# Patient Record
Sex: Female | Born: 1990 | Race: White | Hispanic: No | Marital: Single | Smoking: Former smoker
Health system: Southern US, Community
[De-identification: ages and names within clinical notes are randomized; demographics above are authoritative.]

## PROBLEM LIST (undated history)

## (undated) DIAGNOSIS — F1010 Alcohol abuse, uncomplicated: Secondary | ICD-10-CM

## (undated) DIAGNOSIS — B019 Varicella without complication: Secondary | ICD-10-CM

## (undated) DIAGNOSIS — F319 Bipolar disorder, unspecified: Secondary | ICD-10-CM

## (undated) DIAGNOSIS — N83209 Unspecified ovarian cyst, unspecified side: Secondary | ICD-10-CM

## (undated) DIAGNOSIS — F419 Anxiety disorder, unspecified: Secondary | ICD-10-CM

## (undated) DIAGNOSIS — J302 Other seasonal allergic rhinitis: Secondary | ICD-10-CM

## (undated) DIAGNOSIS — F32A Depression, unspecified: Secondary | ICD-10-CM

## (undated) DIAGNOSIS — F329 Major depressive disorder, single episode, unspecified: Secondary | ICD-10-CM

## (undated) HISTORY — DX: Major depressive disorder, single episode, unspecified: F32.9

## (undated) HISTORY — DX: Depression, unspecified: F32.A

## (undated) HISTORY — DX: Other seasonal allergic rhinitis: J30.2

## (undated) HISTORY — PX: TONSILLECTOMY: SUR1361

## (undated) HISTORY — DX: Bipolar disorder, unspecified: F31.9

## (undated) HISTORY — DX: Anxiety disorder, unspecified: F41.9

## (undated) HISTORY — DX: Varicella without complication: B01.9

## (undated) HISTORY — DX: Alcohol abuse, uncomplicated: F10.10

---

## 2015-04-08 ENCOUNTER — Emergency Department (HOSPITAL_COMMUNITY)
Admission: EM | Admit: 2015-04-08 | Discharge: 2015-04-08 | Disposition: A | Discharge status: Home / Self Care | Payer: Self-pay | Attending: Emergency Medicine | Admitting: Emergency Medicine

## 2015-04-08 ENCOUNTER — Encounter (HOSPITAL_COMMUNITY): Payer: Self-pay

## 2015-04-08 DIAGNOSIS — Z720 Tobacco use: Secondary | ICD-10-CM | POA: Insufficient documentation

## 2015-04-08 DIAGNOSIS — Z3202 Encounter for pregnancy test, result negative: Secondary | ICD-10-CM | POA: Insufficient documentation

## 2015-04-08 DIAGNOSIS — K921 Melena: Secondary | ICD-10-CM | POA: Insufficient documentation

## 2015-04-08 LAB — URINALYSIS, ROUTINE W REFLEX MICROSCOPIC
Glucose, UA: NEGATIVE mg/dL
Hgb urine dipstick: NEGATIVE
KETONES UR: 15 mg/dL — AB
Leukocytes, UA: NEGATIVE
NITRITE: NEGATIVE
PROTEIN: NEGATIVE mg/dL
SPECIFIC GRAVITY, URINE: 1.031 — AB (ref 1.005–1.030)
UROBILINOGEN UA: 1 mg/dL (ref 0.0–1.0)
pH: 6.5 (ref 5.0–8.0)

## 2015-04-08 LAB — COMPREHENSIVE METABOLIC PANEL
ALT: 11 U/L — ABNORMAL LOW (ref 14–54)
ANION GAP: 8 (ref 5–15)
AST: 22 U/L (ref 15–41)
Albumin: 4.2 g/dL (ref 3.5–5.0)
Alkaline Phosphatase: 73 U/L (ref 38–126)
BILIRUBIN TOTAL: 0.8 mg/dL (ref 0.3–1.2)
BUN: 13 mg/dL (ref 6–20)
CALCIUM: 9.5 mg/dL (ref 8.9–10.3)
CO2: 26 mmol/L (ref 22–32)
Chloride: 102 mmol/L (ref 101–111)
Creatinine, Ser: 0.77 mg/dL (ref 0.44–1.00)
GFR calc Af Amer: >60 mL/min (ref >60)
Glucose, Bld: 93 mg/dL (ref 65–99)
POTASSIUM: 4.5 mmol/L (ref 3.5–5.1)
Sodium: 136 mmol/L (ref 135–145)
TOTAL PROTEIN: 7.2 g/dL (ref 6.5–8.1)

## 2015-04-08 LAB — CBC
HEMATOCRIT: 44 % (ref 36.0–46.0)
Hemoglobin: 14.8 g/dL (ref 12.0–15.0)
MCH: 32 pg (ref 26.0–34.0)
MCHC: 33.6 g/dL (ref 30.0–36.0)
MCV: 95 fL (ref 78.0–100.0)
Platelets: 201 10*3/uL (ref 150–400)
RBC: 4.63 MIL/uL (ref 3.87–5.11)
RDW: 13 % (ref 11.5–15.5)
WBC: 7 10*3/uL (ref 4.0–10.5)

## 2015-04-08 LAB — POC URINE PREG, ED: PREG TEST UR: NEGATIVE

## 2015-04-08 LAB — LIPASE, BLOOD: Lipase: 27 U/L (ref 22–51)

## 2015-04-08 LAB — POC OCCULT BLOOD, ED: FECAL OCCULT BLD: NEGATIVE

## 2015-04-08 NOTE — ED Notes (Signed)
Pt had bowel movement this morning and states it was "bright red, loose liquid stool." Pt reports that she drinks minimum 1 bottle wine and max 2 bottles of wine a night. Pt also smokes .5 packs cigarettes per day.

## 2015-04-08 NOTE — ED Provider Notes (Signed)
CSN: [id number]     Arrival date & time 04/08/15  1039 History   First MD Initiated Contact with Patient 04/08/15 1103     Chief Complaint  Patient presents with  . Blood In Stools     (Consider location/radiation/quality/duration/timing/severity/associated sxs/prior Treatment) HPI Comments: Pt is a 24 yo female who presents to the ED with complaint of blood in stool, onset this morning. Pt reports having a BM this morning when she woke up and noted blood in her stool. She notes that she has chronic diarrhea. Pt then states she had a second BM and reports bright red loose liquid stool. She endorses nausea and constant "aching" lower abdominal pain that she notes has improved since her last BM. Denies fever, headache, SOB, CP, vomiting, constipation, urinary sxs, hematuria, numbness, tingling, weakness, lightheadedness, dizziness. Pt states that she has a drinking problem and has been trying to go to AA. Pt reports having hemorrhoids in the past. LMP 2 weeks ago.   No past medical history on file. No past surgical history on file. No family history on file. Social History  Substance Use Topics  . Smoking status: Current Every Day Smoker -- 0.50 packs/day    Types: Cigarettes  . Smokeless tobacco: Not on file  . Alcohol Use: Yes     Comment: 1-2 bottles/night    OB History    No data available     Review of Systems  Gastrointestinal: Positive for nausea, abdominal pain and blood in stool.  All other systems reviewed and are negative.     Allergies  Review of patient's allergies indicates no known allergies.  Home Medications   Prior to Admission medications   Not on File   BP 126/77 mmHg  Pulse 75  Temp(Src) 98.2 F (36.8 C) (Oral)  Resp 16  Ht 5\' 6"  (1.676 m)  Wt 170 lb (77.111 kg)  BMI 27.45 kg/m2  SpO2 100%  LMP 03/26/2015 (Approximate) Physical Exam  Constitutional: She is oriented to person, place, and time. She appears well-developed and well-nourished. No  distress.  HENT:  Head: Normocephalic and atraumatic.  Mouth/Throat: Oropharynx is clear and moist.  Eyes: Conjunctivae and EOM are normal. Pupils are equal, round, and reactive to light. Right eye exhibits no discharge. Left eye exhibits no discharge. No scleral icterus.  Neck: Normal range of motion. Neck supple.  Cardiovascular: Normal rate, regular rhythm, normal heart sounds and intact distal pulses.   Pulmonary/Chest: Effort normal and breath sounds normal. She has no wheezes. She has no rales. She exhibits no tenderness.  Abdominal: Soft. Bowel sounds are normal. She exhibits no distension and no mass. There is no tenderness. There is no rebound and no guarding.  Genitourinary: Rectum normal. Rectal exam shows no external hemorrhoid, no internal hemorrhoid, no fissure, no mass, no tenderness and anal tone normal.  Musculoskeletal: She exhibits no edema.  Lymphadenopathy:    She has no cervical adenopathy.  Neurological: She is alert and oriented to person, place, and time.  Skin: Skin is warm and dry.  Nursing note and vitals reviewed.   ED Course  Procedures (including critical care time) Labs Review Labs Reviewed  COMPREHENSIVE METABOLIC PANEL - Abnormal; Notable for the following:    ALT 11 (*)    All other components within normal limits  URINALYSIS, ROUTINE W REFLEX MICROSCOPIC (NOT AT [facility]) - Abnormal; Notable for the following:    APPearance HAZY (*)    Specific Gravity, Urine 1.031 (*)    Bilirubin Urine  SMALL (*)    Ketones, ur 15 (*)    All other components within normal limits  LIPASE, BLOOD  CBC  POC URINE PREG, ED  POC OCCULT BLOOD, ED    Imaging Review No results found. I have personally reviewed and evaluated these images and lab results as part of my medical decision-making.  Filed Vitals:   04/08/15 1400  BP: 128/76  Pulse: 84  Temp:   Resp: 16     MDM   Final diagnoses:  Blood in stool    Pt reports with blood in her loose stool x2 this  morning. Associated abdominal pain and nausea that improved after 2nd BM. Pt reports chronic diarrhea. VSS. Abdominal exam benign, rectal exam revealed no hemorrhoids, fissures or occult bleeding. Hemoccult preformed but no stool was able to be collected during exam. CBC, CMP, lipase, UA unremarkable. Urine pregnancy negative. I do not feel that imaging is warranted at this time, pt is hemodynamically stable, abdominal exam benign. Plan to d/c pt home with PCP follow up, pt given Resource Guide. Pt advised to follow up with PCP this week and return precautions given. Pt agrees with plan.     Caitlyn Hall [place], [place] 04/08/15 1429  Caitlyn Memos, MD 04/09/15 [phone]

## 2015-04-08 NOTE — Discharge Instructions (Signed)
Please follow up with a primary care provider listed in the Resource Guide provided below in the next week. Please return to the Emergency Department if symptoms worsen or new onset of fever, abdominal pain, vomiting, constipation, increase bleeding, dizziness, lightheadedness.   Emergency Department Resource Guide 1) Find a Doctor and Pay Out of Pocket Although you won't have to find out who is covered by your insurance plan, it is a good idea to ask around and get recommendations. You will then need to call the office and see if the doctor you have chosen will accept you as a new patient and what types of options they offer for patients who are self-pay. Some doctors offer discounts or will set up payment plans for their patients who do not have insurance, but you will need to ask so you aren't surprised when you get to your appointment.  2) Contact Your Local Health Department Not all health departments have doctors that can see patients for sick visits, but many do, so it is worth a call to see if yours does. If you don't know where your local health department is, you can check in your phone book. The CDC also has a tool to help you locate your state's health department, and many state websites also have listings of all of their local health departments.  3) Find a Walk-in Clinic If your illness is not likely to be very severe or complicated, you may want to try a walk in clinic. These are popping up all over the country in pharmacies, drugstores, and shopping centers. They're usually staffed by nurse practitioners or physician assistants that have been trained to treat common illnesses and complaints. They're usually fairly quick and inexpensive. However, if you have serious medical issues or chronic medical problems, these are probably not your best option.  No Primary Care Doctor: - Call Health Connect at  [phone] - they can help you locate a primary care doctor that  accepts your insurance,  provides certain services, etc. - Physician Referral Service- [phone]  Chronic Pain Problems: Organization         Address  Phone   Notes  [facility]  [phone] Patients need to be referred by their primary care doctor.   Medication Assistance: Organization         Address  Phone   Notes  [facility] Medication [facility] [address] [place] [place], [place] [address] [phone] --Must be a resident of [facility] -- Must have NO insurance coverage whatsoever (no Medicaid/ Medicare, etc.) -- The pt. MUST have a primary care doctor that directs their care regularly and follows them in the community   [facility]  [phone]   [employer]  [phone]    Agencies that provide inexpensive medical care: Organization         Address  Phone   Notes  [facility]  [phone]   [facility]    [phone]   [facility] [address] [place], [place] [address] [phone]   [facility] [place] [facility] [address] [place]. [address] [place] [phone]   Planned Parenthood    [phone]   [facility]    [phone]   Community Health and [facility]  [address], [place] Phone:  [phone], Fax:  [phone] Hours of Operation:  9 am - 6 pm, M-F.  Also accepts Medicaid/Medicare  and self-pay.  [facility] for [place], [place] Phone: [phone], Fax: [phone]. Hours of Operation:  8:30 am - 5:30 pm, M-F.  Also accepts Medicaid and self-pay.  [facility] [place] [address] [place] Phone: [phone]   [place], [place], [place] [phone], Ext. [phone] Mondays & Thursdays: 7-9 AM.  First 15 patients are seen on a first come, first serve basis.    [place] Providers:  Organization         Address  Phone    Notes  [facility] [address], [place] [phone] Also accepts self-pay patients.  [facility] [address], [place]  [phone]   [place], [address], [place] [phone]   [facility] [address] [place] [phone]   Lucianne Lei [address], [place]   [phone] Only accepts [place] Access [place] patients after they have their name applied to their card.   Self-Pay (no insurance) in [facility]:  Organization         Address  Phone   Notes  Sickle Cell Patients, [facility] [place] [phone]   [facility] [place] [phone]   [facility] [place]  [place], [place], [place] [phone]   [facility]/Dr. Osei-Bonsu  [address] [place] or [address], [place] [phone] Phone number for both [place] and [place] locations is the same.  Urgent Medical and [facility] [address] [place] [phone]   [facility] [address] [place] or [address] [phone]   [facility] [address] [place] [phone], phone; [phone], fax Sees patients 1st and 3rd Saturday of every month.  Must not qualify for public or private insurance (i.e. Medicaid, Medicare, [place] Health Choice, Veterans' Benefits)  Household income should be no more than 200% of the poverty level The clinic cannot treat you if you are pregnant or think you are pregnant  Sexually transmitted diseases are not treated at the clinic.    Dental Care: Organization         Address  Phone  Notes  [facility] Department of [facility] [place] [phone] Accepts children up to age 87 who are enrolled in [place] or [place]; pregnant women with a Medicaid card; and children who have applied for Medicaid or [place] Health Choice, but were declined, whose parents can pay a reduced fee at time of service.  [facility] Department of [facility]  [address], [place] [phone] Accepts children up to age 37 who are enrolled in [place] or [place]; pregnant women with a Medicaid card; and children who have applied for Medicaid or [place] Health Choice, but were declined, whose parents can pay a reduced fee at time of service.  [place] Adult [facility] Access PROGRAM  [place] [phone] Patients are seen by appointment only. Walk-ins are not accepted. [place] will see patients 36 years of age and older. Monday - Tuesday (8am-5pm) Most Wednesdays (  8:30-5pm) $30 per visit, cash only  [facility] Adult Dental Access PROGRAM  [address], [facility] [phone] Patients are seen by appointment only. Walk-ins are not accepted. [place] will see patients 81 years of age and older. One Wednesday Evening (Monthly: Volunteer Based).  $30 per visit, cash only  [place]  [phone] for adults; Children under age 43, call Graduate Pediatric Dentistry at [phone]. Children aged 15-14, please call [phone] to request a pediatric application.  Dental services are provided in all areas of dental care including fillings, crowns and bridges, complete and partial dentures, implants, gum treatment, root canals, and extractions. Preventive care is also provided. Treatment is provided to both adults and children. Patients are selected via a lottery and there is often a waiting list.   [facility] [address], [place]  [phone] [email or web address]   [facility] [address] [place], [place] [phone], Ext.  [phone] Second and Fourth Thursday of each month, opens at 6:30 AM; Clinic ends at 9 AM.  Patients are seen on a first-come first-served basis, and a limited number are seen during each clinic.   [facility]  [address] Hillard Danker [place], [place] [phone]   Eligibility Requirements You must have lived in [place], [place], or [place] counties for at least the last three months.   You cannot be eligible for state or federal sponsored [employer], including [employer], [place], or [employer].   You generally cannot be eligible for healthcare insurance through your employer.    How to apply: Eligibility screenings are held every Tuesday and Wednesday afternoon from 1:00 pm until 4:00 pm. You do not need an appointment for the interview!  [facility] [address], [place], [place]   [place]  [place] Department  [place]  [phone]    Behavioral Health Resources in the Community: Intensive Outpatient Programs Organization         Address  Phone  Notes  [place]. [address], [place], [place] [phone]   [facility] Outpatient [address], [place], [place]   ADS: Alcohol & Drug Svcs [address], [place], [place]   [place] [address]  [place], [place] or [phone]   Substance Abuse Resources Organization         Address  Phone  Notes  Alcohol and Drug Services  [phone]   [place]  [phone]   [facility] [place]   Chinita Pester  [phone]   Residential & Outpatient Substance Abuse Program  [phone]   Psychological Services Organization         Address  Phone  Notes  [facility] [place]  [place]  [phone]   [place] [address], [place] or [phone]    Mobile Crisis Teams Organization         Address  Phone  Notes  [facility], Mobile Crisis Care Unit  [phone]   [facility]  [address] [place], [place]   [place] [address], [place] [phone]    Self-Help/Support Groups Organization         Address  Phone  Notes  [facility]ental [facility] of [place] - variety of support groups  [place] Call for more information  Narcotics Anonymous (NA), [facility] [address], [employer] [place]  2 meetings at this location   Special educational needs teacher         Address  Phone  Notes  [facility] Residential Treatment [place],    [place]  [phone]   [facility]  [address] [place] [phone], [place], [place]   [place], [place] [phone] Admissions: 8am-3pm M-F  Incentives Substance [place] [address]    [place], [place] [phone]   [facility] [address] [place], [place], [place]   [facility] [address],  [place], [place]   [facility] - Intensive Outpatient [address] Kristeen [address], [place], [place]   [facility] ([place].,  [place], [place] [phone] or [phone]   [facility] Services (RTS) [address], [place], [place] Accepts Medicaid  [facility] [place] [address]  [place] [phone] Substance Abuse/Addiction Treatment   [facility] Organization         Address  Phone  Notes  [facility]  [phone]   Domenic Schwab, PhD [address] Arlis Porta [place], [place]   [phone] or [phone]   [place]  [place], [place] [phone]   [facility] [address] [place], [place] [phone] Insurance/Medicaid/sponsorship through [facility] and Families [address]                                    [place], [place] [phone] [place] [address], [place] [phone]    Dr. Adele Schilder  [phone]   [facility] Dept. 1) [address] [place] 2) [place] 3)  [place] [address], [place] [phone]  [phone]   [place] [phone] or [phone] (After Hours)

## 2015-10-01 ENCOUNTER — Emergency Department (HOSPITAL_COMMUNITY)
Admission: EM | Admit: 2015-10-01 | Discharge: 2015-10-01 | Disposition: A | Discharge status: Home / Self Care | Payer: Self-pay | Attending: Emergency Medicine | Admitting: Emergency Medicine

## 2015-10-01 ENCOUNTER — Encounter (HOSPITAL_COMMUNITY): Payer: Self-pay | Admitting: Emergency Medicine

## 2015-10-01 ENCOUNTER — Emergency Department (HOSPITAL_COMMUNITY): Payer: Self-pay

## 2015-10-01 DIAGNOSIS — S3991XA Unspecified injury of abdomen, initial encounter: Secondary | ICD-10-CM | POA: Insufficient documentation

## 2015-10-01 DIAGNOSIS — Y9289 Other specified places as the place of occurrence of the external cause: Secondary | ICD-10-CM | POA: Insufficient documentation

## 2015-10-01 DIAGNOSIS — Z8742 Personal history of other diseases of the female genital tract: Secondary | ICD-10-CM | POA: Insufficient documentation

## 2015-10-01 DIAGNOSIS — Y998 Other external cause status: Secondary | ICD-10-CM | POA: Insufficient documentation

## 2015-10-01 DIAGNOSIS — W541XXA Struck by dog, initial encounter: Secondary | ICD-10-CM | POA: Insufficient documentation

## 2015-10-01 DIAGNOSIS — R110 Nausea: Secondary | ICD-10-CM | POA: Insufficient documentation

## 2015-10-01 DIAGNOSIS — F17210 Nicotine dependence, cigarettes, uncomplicated: Secondary | ICD-10-CM | POA: Insufficient documentation

## 2015-10-01 DIAGNOSIS — Z3202 Encounter for pregnancy test, result negative: Secondary | ICD-10-CM | POA: Insufficient documentation

## 2015-10-01 DIAGNOSIS — R1032 Left lower quadrant pain: Secondary | ICD-10-CM

## 2015-10-01 DIAGNOSIS — Y9389 Activity, other specified: Secondary | ICD-10-CM | POA: Insufficient documentation

## 2015-10-01 HISTORY — DX: Unspecified ovarian cyst, unspecified side: N83.209

## 2015-10-01 LAB — URINALYSIS, ROUTINE W REFLEX MICROSCOPIC
BILIRUBIN URINE: NEGATIVE
GLUCOSE, UA: NEGATIVE mg/dL
Hgb urine dipstick: NEGATIVE
KETONES UR: NEGATIVE mg/dL
LEUKOCYTES UA: NEGATIVE
NITRITE: NEGATIVE
PROTEIN: NEGATIVE mg/dL
Specific Gravity, Urine: >1.046 — ABNORMAL HIGH (ref 1.005–1.030)
pH: 7 (ref 5.0–8.0)

## 2015-10-01 LAB — COMPREHENSIVE METABOLIC PANEL
ALK PHOS: 75 U/L (ref 38–126)
ALT: 14 U/L (ref 14–54)
AST: 27 U/L (ref 15–41)
Albumin: 4.2 g/dL (ref 3.5–5.0)
Anion gap: 10 (ref 5–15)
BILIRUBIN TOTAL: 0.4 mg/dL (ref 0.3–1.2)
BUN: 8 mg/dL (ref 6–20)
CALCIUM: 9.5 mg/dL (ref 8.9–10.3)
CO2: 27 mmol/L (ref 22–32)
CREATININE: 0.77 mg/dL (ref 0.44–1.00)
Chloride: 103 mmol/L (ref 101–111)
GFR calc Af Amer: >60 mL/min (ref >60)
Glucose, Bld: 118 mg/dL — ABNORMAL HIGH (ref 65–99)
Potassium: 4.7 mmol/L (ref 3.5–5.1)
Sodium: 140 mmol/L (ref 135–145)
TOTAL PROTEIN: 6.6 g/dL (ref 6.5–8.1)

## 2015-10-01 LAB — I-STAT BETA HCG BLOOD, ED (MC, WL, AP ONLY): I-stat hCG, quantitative: <5 m[IU]/mL (ref <5)

## 2015-10-01 LAB — CBC
HCT: 44.6 % (ref 36.0–46.0)
Hemoglobin: 14.4 g/dL (ref 12.0–15.0)
MCH: 30.6 pg (ref 26.0–34.0)
MCHC: 32.3 g/dL (ref 30.0–36.0)
MCV: 94.7 fL (ref 78.0–100.0)
PLATELETS: 197 10*3/uL (ref 150–400)
RBC: 4.71 MIL/uL (ref 3.87–5.11)
RDW: 13 % (ref 11.5–15.5)
WBC: 7.4 10*3/uL (ref 4.0–10.5)

## 2015-10-01 LAB — LIPASE, BLOOD: Lipase: 29 U/L (ref 11–51)

## 2015-10-01 MED ORDER — MORPHINE SULFATE (PF) 4 MG/ML IV SOLN
4.0000 mg | Freq: Once | INTRAVENOUS | Status: AC
  Administered 2015-10-01: 4 mg via INTRAVENOUS
  Filled 2015-10-01: qty 1

## 2015-10-01 MED ORDER — IOHEXOL 300 MG/ML  SOLN
25.0000 mL | Freq: Once | INTRAMUSCULAR | Status: AC | PRN
  Administered 2015-10-01: 25 mL via ORAL

## 2015-10-01 MED ORDER — IOHEXOL 300 MG/ML  SOLN
100.0000 mL | Freq: Once | INTRAMUSCULAR | Status: AC | PRN
  Administered 2015-10-01: 100 mL via INTRAVENOUS

## 2015-10-01 MED ORDER — ONDANSETRON HCL 4 MG/2ML IJ SOLN
4.0000 mg | Freq: Once | INTRAMUSCULAR | Status: AC
  Administered 2015-10-01: 4 mg via INTRAVENOUS
  Filled 2015-10-01: qty 2

## 2015-10-01 MED ORDER — OXYCODONE HCL 5 MG PO TABS: 5.0000 mg | ORAL_TABLET | ORAL | Status: DC | PRN

## 2015-10-01 MED ORDER — SODIUM CHLORIDE 0.9 % IV SOLN
INTRAVENOUS | Status: DC
  Administered 2015-10-01: 08:00:00 via INTRAVENOUS

## 2015-10-01 NOTE — ED Provider Notes (Addendum)
CSN: [id number]     Arrival date & time 10/01/15  2130 History   First MD Initiated Contact with Patient 10/01/15 0751     Chief Complaint  Patient presents with  . Abdominal Pain     (Consider location/radiation/quality/duration/timing/severity/associated sxs/prior Treatment) HPI Comments: Patient here complaining of left upper and left lower quadrant abdominal pain started after her dog jumped on her belly. This happened approximately 2 days ago and pain characterized as dull and persistent and increasing. She has had associated nausea but no vomiting. No flank pain or hematuria. No vaginal bleeding or discharge. Had some watery diarrhea and has been experiencing a viral illness. No syncope or near syncope but does endorse diffuse weakness. Pain is worse with walking or bending over. No treatment use prior to arrival  Patient is a 25 y.o. female presenting with abdominal pain. The history is provided by the patient.  Abdominal Pain   Past Medical History  Diagnosis Date  . Ovarian cyst    Past Surgical History  Procedure Laterality Date  . Tonsillectomy     No family history on file. Social History  Substance Use Topics  . Smoking status: Current Every Day Smoker -- 0.50 packs/day    Types: Cigarettes  . Smokeless tobacco: None  . Alcohol Use: No     Comment: former   OB History    No data available     Review of Systems  Gastrointestinal: Positive for abdominal pain.  All other systems reviewed and are negative.     Allergies  Amitriptyline; Amphetamines; Atomoxetine; Carisoprodol; Celecoxib; Codeine; Dextroamphetamine; Diclofenac; Doxepin; Fluoxetine; Guanfacine; Ibuprofen; Imipramine; Lisdexamfetamine; Meloxicam; Meperidine and related; Methadone; Naltrexone; Selegiline; Tramadol; and Venlafaxine  Home Medications   Prior to Admission medications   Not on File   BP 150/99 mmHg  Pulse 101  Temp(Src) 98.5 F (36.9 C) (Oral)  Resp 18  Wt 72.712 kg  SpO2  100%  LMP 09/17/2015 Physical Exam  Constitutional: She is oriented to person, place, and time. She appears well-developed and well-nourished.  Non-toxic appearance. No distress.  HENT:  Head: Normocephalic and atraumatic.  Eyes: Conjunctivae, EOM and lids are normal. Pupils are equal, round, and reactive to light.  Neck: Normal range of motion. Neck supple. No tracheal deviation present. No thyroid mass present.  Cardiovascular: Normal rate, regular rhythm and normal heart sounds.  Exam reveals no gallop.   No murmur heard. Pulmonary/Chest: Effort normal and breath sounds normal. No stridor. No respiratory distress. She has no decreased breath sounds. She has no wheezes. She has no rhonchi. She has no rales.  Abdominal: Soft. Normal appearance and bowel sounds are normal. She exhibits no distension. There is no tenderness. There is no rigidity, no rebound, no guarding and no CVA tenderness.    Musculoskeletal: Normal range of motion. She exhibits no edema or tenderness.  Neurological: She is alert and oriented to person, place, and time. She has normal strength. No cranial nerve deficit or sensory deficit. GCS eye subscore is 4. GCS verbal subscore is 5. GCS motor subscore is 6.  Skin: Skin is warm and dry. No abrasion and no rash noted.  Psychiatric: She has a normal mood and affect. Her speech is normal and behavior is normal.  Nursing note and vitals reviewed.   ED Course  Procedures (including critical care time) Labs Review Labs Reviewed  LIPASE, BLOOD  COMPREHENSIVE METABOLIC PANEL  CBC  URINALYSIS, ROUTINE W REFLEX MICROSCOPIC (NOT AT [facility])  I-STAT BETA HCG BLOOD, ED (  [facility], [facility], [facility] ONLY)    Imaging Review No results found. I have personally reviewed and evaluated these images and lab results as part of my medical decision-making.   EKG Interpretation None      MDM   Final diagnoses:  None    Patient given pain meds here and does flow better. Abdominal CT results  reviewed with her. She has known history of ovarian cysts and will follow-up with her gynecologist    Lorre NickAnthony Brylon Brenning, MD 10/01/15 1105  Lorre NickAnthony Joslyn Ramos, MD 10/01/15 1105

## 2015-10-01 NOTE — ED Notes (Signed)
Patient transported to CT 

## 2015-10-01 NOTE — Discharge Instructions (Signed)
Abdominal Pain, Adult °Many things can cause abdominal pain. Usually, abdominal pain is not caused by a disease and will improve without treatment. It can often be observed and treated at home. Your health care provider will do a physical exam and possibly order blood tests and X-rays to help determine the seriousness of your pain. However, in many cases, more time must pass before a clear cause of the pain can be found. Before that point, your health care provider may not know if you need more testing or further treatment. °HOME CARE INSTRUCTIONS °Monitor your abdominal pain for any changes. The following actions may help to alleviate any discomfort you are experiencing: °· Only take over-the-counter or prescription medicines as directed by your health care provider. °· Do not take laxatives unless directed to do so by your health care provider. °· Try a clear liquid diet (broth, tea, or water) as directed by your health care provider. Slowly move to a bland diet as tolerated. °SEEK MEDICAL CARE IF: °· You have unexplained abdominal pain. °· You have abdominal pain associated with nausea or diarrhea. °· You have pain when you urinate or have a bowel movement. °· You experience abdominal pain that wakes you in the night. °· You have abdominal pain that is worsened or improved by eating food. °· You have abdominal pain that is worsened with eating fatty foods. °· You have a fever. °SEEK IMMEDIATE MEDICAL CARE IF: °· Your pain does not go away within 2 hours. °· You keep throwing up (vomiting). °· Your pain is felt only in portions of the abdomen, such as the right side or the left lower portion of the abdomen. °· You pass bloody or black tarry stools. °MAKE SURE YOU: °· Understand these instructions. °· Will watch your condition. °· Will get help right away if you are not doing well or get worse. °  °This information is not intended to replace advice given to you by your health care provider. Make sure you discuss  any questions you have with your health care provider. °  °Document Released: 04/17/2005 Document Revised: 03/29/2015 Document Reviewed: 03/17/2013 °Elsevier Interactive Patient Education ©2016 [employer] °Ovarian Cyst °An ovarian cyst is a fluid-filled sac that forms on an ovary. The ovaries are small organs that produce eggs in women. Various types of cysts can form on the ovaries. Most are not cancerous. Many do not cause problems, and they often go away on their own. Some may cause symptoms and require treatment. Common types of ovarian cysts include: °· Functional cysts--These cysts may occur every month during the menstrual cycle. This is normal. The cysts usually go away with the next menstrual cycle if the woman does not get pregnant. Usually, there are no symptoms with a functional cyst. °· Endometrioma cysts--These cysts form from the tissue that lines the uterus. They are also called "chocolate cysts" because they become filled with blood that turns brown. This type of cyst can cause pain in the lower abdomen during intercourse and with your menstrual period. °· Cystadenoma cysts--This type develops from the cells on the outside of the ovary. These cysts can get very big and cause lower abdomen pain and pain with intercourse. This type of cyst can twist on itself, cut off its blood supply, and cause severe pain. It can also easily rupture and cause a lot of pain. °· Dermoid cysts--This type of cyst is sometimes found in both ovaries. These cysts may contain different kinds of body tissue, such as   skin, teeth, hair, or cartilage. They usually do not cause symptoms unless they get very big. °· Theca lutein cysts--These cysts occur when too much of a certain hormone (human chorionic gonadotropin) is produced and overstimulates the ovaries to produce an egg. This is most common after procedures used to assist with the conception of a baby (in vitro fertilization). °CAUSES  °· Fertility drugs can cause a  condition in which multiple large cysts are formed on the ovaries. This is called ovarian hyperstimulation syndrome. °· A condition called polycystic ovary syndrome can cause hormonal imbalances that can lead to nonfunctional ovarian cysts. °SIGNS AND SYMPTOMS  °Many ovarian cysts do not cause symptoms. If symptoms are present, they may include: °· Pelvic pain or pressure. °· Pain in the lower abdomen. °· Pain during sexual intercourse. °· Increasing girth (swelling) of the abdomen. °· Abnormal menstrual periods. °· Increasing pain with menstrual periods. °· Stopping having menstrual periods without being pregnant. °DIAGNOSIS  °These cysts are commonly found during a routine or annual pelvic exam. Tests may be ordered to find out more about the cyst. These tests may include: °· Ultrasound. °· X-ray of the pelvis. °· CT scan. °· MRI. °· Blood tests. °TREATMENT  °Many ovarian cysts go away on their own without treatment. Your health care provider may want to check your cyst regularly for 2-3 months to see if it changes. For women in menopause, it is particularly important to monitor a cyst closely because of the higher rate of ovarian cancer in menopausal women. When treatment is needed, it may include any of the following: °· A procedure to drain the cyst (aspiration). This may be done using a long needle and ultrasound. It can also be done through a laparoscopic procedure. This involves using a thin, lighted tube with a tiny camera on the end (laparoscope) inserted through a small incision. °· Surgery to remove the whole cyst. This may be done using laparoscopic surgery or an open surgery involving a larger incision in the lower abdomen. °· Hormone treatment or birth control pills. These methods are sometimes used to help dissolve a cyst. °HOME CARE INSTRUCTIONS  °· Only take over-the-counter or prescription medicines as directed by your health care provider. °· Follow up with your health care provider as  directed. °· Get regular pelvic exams and Pap tests. °SEEK MEDICAL CARE IF:  °· Your periods are late, irregular, or painful, or they stop. °· Your pelvic pain or abdominal pain does not go away. °· Your abdomen becomes larger or swollen. °· You have pressure on your bladder or trouble emptying your bladder completely. °· You have pain during sexual intercourse. °· You have feelings of fullness, pressure, or discomfort in your stomach. °· You lose weight for no apparent reason. °· You feel generally ill. °· You become constipated. °· You lose your appetite. °· You develop acne. °· You have an increase in body and facial hair. °· You are gaining weight, without changing your exercise and eating habits. °· You think you are pregnant. °SEEK IMMEDIATE MEDICAL CARE IF:  °· You have increasing abdominal pain. °· You feel sick to your stomach (nauseous), and you throw up (vomit). °· You develop a fever that comes on suddenly. °· You have abdominal pain during a bowel movement. °· Your menstrual periods become heavier than usual. °MAKE SURE YOU: °· Understand these instructions. °· Will watch your condition. °· Will get help right away if you are not doing well or get worse. °  °  This information is not intended to replace advice given to you by your health care provider. Make sure you discuss any questions you have with your health care provider. °  °Document Released: 07/08/2005 Document Revised: 07/13/2013 Document Reviewed: 03/15/2013 °Elsevier Interactive Patient Education ©2016 [employer]. ° °

## 2015-10-01 NOTE — ED Notes (Signed)
Pt reports that Friday night her 12 lb dog jumped on her stomach. Saturday she was unable to walk without bending over. Pt reports nausea with dry heaves.

## 2015-12-16 ENCOUNTER — Emergency Department (HOSPITAL_COMMUNITY): Payer: Self-pay

## 2015-12-16 ENCOUNTER — Emergency Department (HOSPITAL_COMMUNITY)
Admission: EM | Admit: 2015-12-16 | Discharge: 2015-12-16 | Disposition: A | Discharge status: Home / Self Care | Payer: Self-pay | Attending: Emergency Medicine | Admitting: Emergency Medicine

## 2015-12-16 ENCOUNTER — Encounter (HOSPITAL_COMMUNITY): Payer: Self-pay

## 2015-12-16 DIAGNOSIS — R0602 Shortness of breath: Secondary | ICD-10-CM | POA: Insufficient documentation

## 2015-12-16 DIAGNOSIS — Z79891 Long term (current) use of opiate analgesic: Secondary | ICD-10-CM | POA: Insufficient documentation

## 2015-12-16 DIAGNOSIS — F17210 Nicotine dependence, cigarettes, uncomplicated: Secondary | ICD-10-CM | POA: Insufficient documentation

## 2015-12-16 MED ORDER — ALBUTEROL SULFATE HFA 108 (90 BASE) MCG/ACT IN AERS: 1.0000 | INHALATION_SPRAY | Freq: Four times a day (QID) | RESPIRATORY_TRACT | Status: DC | PRN

## 2015-12-16 MED ORDER — PREDNISONE 10 MG PO TABS: ORAL_TABLET | ORAL | Status: DC

## 2015-12-16 NOTE — Discharge Instructions (Signed)
Cough, Adult °Coughing is a reflex that clears your throat and your airways. Coughing helps to heal and protect your lungs. It is normal to cough occasionally, but a cough that happens with other symptoms or lasts a long time may be a sign of a condition that needs treatment. A cough may last only 2-3 weeks (acute), or it may last longer than 8 weeks (chronic). °CAUSES °Coughing is commonly caused by: °· Breathing in substances that irritate your lungs. °· A viral or bacterial respiratory infection. °· Allergies. °· Asthma. °· Postnasal drip. °· Smoking. °· Acid backing up from the stomach into the esophagus (gastroesophageal reflux). °· Certain medicines. °· Chronic lung problems, including COPD (or rarely, lung cancer). °· Other medical conditions such as heart failure. °HOME CARE INSTRUCTIONS  °Pay attention to any changes in your symptoms. Take these actions to help with your discomfort: °· Take medicines only as told by your health care provider. °· If you were prescribed an antibiotic medicine, take it as told by your health care provider. Do not stop taking the antibiotic even if you start to feel better. °· Talk with your health care provider before you take a cough suppressant medicine. °· Drink enough fluid to keep your urine clear or pale yellow. °· If the air is dry, use a cold steam vaporizer or humidifier in your bedroom or your home to help loosen secretions. °· Avoid anything that causes you to cough at work or at home. °· If your cough is worse at night, try sleeping in a semi-upright position. °· Avoid cigarette smoke. If you smoke, quit smoking. If you need help quitting, ask your health care provider. °· Avoid caffeine. °· Avoid alcohol. °· Rest as needed. °SEEK MEDICAL CARE IF:  °· You have new symptoms. °· You cough up pus. °· Your cough does not get better after 2-3 weeks, or your cough gets worse. °· You cannot control your cough with suppressant medicines and you are losing sleep. °· You  develop pain that is getting worse or pain that is not controlled with pain medicines. °· You have a fever. °· You have unexplained weight loss. °· You have night sweats. °SEEK IMMEDIATE MEDICAL CARE IF: °· You cough up blood. °· You have difficulty breathing. °· Your heartbeat is very fast. °  °This information is not intended to replace advice given to you by your health care provider. Make sure you discuss any questions you have with your health care provider. °  °Document Released: 01/04/2011 Document Revised: 03/29/2015 Document Reviewed: 09/14/2014 °Elsevier Interactive Patient Education ©2016 [employer] Inc. ° °Shortness of Breath °Shortness of breath means you have trouble breathing. It could also mean that you have a medical problem. You should get immediate medical care for shortness of breath. °CAUSES  °· Not enough oxygen in the air such as with high altitudes or a smoke-filled room. °· Certain lung diseases, infections, or problems. °· Heart disease or conditions, such as angina or heart failure. °· Low red blood cells (anemia). °· Poor physical fitness, which can cause shortness of breath when you exercise. °· Chest or back injuries or stiffness. °· Being overweight. °· Smoking. °· Anxiety, which can make you feel like you are not getting enough air. °DIAGNOSIS  °Serious medical problems can often be found during your physical exam. Tests may also be done to determine why you are having shortness of breath. Tests may include: °· Chest X-rays. °· Lung function tests. °· Blood tests. °· An electrocardiogram (  ECG). °· An ambulatory electrocardiogram. An ambulatory ECG records your heartbeat patterns over a 24-hour period. °· Exercise testing. °· A transthoracic echocardiogram (TTE). During echocardiography, sound waves are used to evaluate how blood flows through your heart. °· A transesophageal echocardiogram (TEE). °· Imaging scans. °Your health care provider may not be able to find a cause for your  shortness of breath after your exam. In this case, it is important to have a follow-up exam with your health care provider as directed.  °TREATMENT  °Treatment for shortness of breath depends on the cause of your symptoms and can vary greatly. °HOME CARE INSTRUCTIONS  °· Do not smoke. Smoking is a common cause of shortness of breath. If you smoke, ask for help to quit. °· Avoid being around chemicals or things that may bother your breathing, such as paint fumes and dust. °· Rest as needed. Slowly resume your usual activities. °· If medicines were prescribed, take them as directed for the full length of time directed. This includes oxygen and any inhaled medicines. °· Keep all follow-up appointments as directed by your health care provider. °SEEK MEDICAL CARE IF:  °· Your condition does not improve in the time expected. °· You have a hard time doing your normal activities even with rest. °· You have any new symptoms. °SEEK IMMEDIATE MEDICAL CARE IF:  °· Your shortness of breath gets worse. °· You feel light-headed, faint, or develop a cough not controlled with medicines. °· You start coughing up blood. °· You have pain with breathing. °· You have chest pain or pain in your arms, shoulders, or abdomen. °· You have a fever. °· You are unable to walk up stairs or exercise the way you normally do. °MAKE SURE YOU: °· Understand these instructions. °· Will watch your condition. °· Will get help right away if you are not doing well or get worse. °  °This information is not intended to replace advice given to you by your health care provider. Make sure you discuss any questions you have with your health care provider. °  °Document Released: 04/02/2001 Document Revised: 07/13/2013 Document Reviewed: 09/23/2011 °Elsevier Interactive Patient Education ©2016 [employer] Inc. ° °

## 2015-12-16 NOTE — ED Notes (Signed)
Patient here with ongoing shortness of breath since January. Has been treated for bronchitis x 2 with same and reports cough

## 2015-12-16 NOTE — ED Notes (Signed)
Pt reports she was at work and kept hyperventilating and felt like she was going to pass out.

## 2015-12-16 NOTE — ED Notes (Signed)
Declined W/C at D/C and was escorted to lobby by RN. 

## 2015-12-16 NOTE — ED Provider Notes (Signed)
CSN: [id number]     Arrival date & time 12/16/15  0957 History  By signing my name below, I, Phillis HaggisGabriella Gaje, attest that this documentation has been prepared under the direction and in the presence of Langston MaskerKaren Rhyatt [place], [place]-C. Electronically Signed: Phillis HaggisGabriella Gaje, ED Scribe. 12/16/2015. 10:25 AM.   Chief Complaint  Patient presents with  . Cough   The history is provided by the patient. No language interpreter was used.  HPI Comments: Caitlyn Hall is a 25 y.o. female who presents to the Emergency Department complaining of ongoing SOB onset 4 months ago. She states that she has been treated for bronchitis in the past for which she took steroid packs. Pt states she is unable to work due to her SOB and has been "hyperventilating to the point of almost passing out. They made me leave work because it's a lawsuit waiting to happen." She does heavy lifting at work. Pt is an occasional smoker. She states that she has a lot of stress and anxiety. She denies fever, chills, nausea, vomiting, or current cough.   Past Medical History  Diagnosis Date  . Ovarian cyst    Past Surgical History  Procedure Laterality Date  . Tonsillectomy     No family history on file. Social History  Substance Use Topics  . Smoking status: Current Every Day Smoker -- 0.50 packs/day    Types: Cigarettes  . Smokeless tobacco: None  . Alcohol Use: No     Comment: former   OB History    No data available     Review of Systems  Constitutional: Negative for fever and chills.  Respiratory: Positive for shortness of breath. Negative for cough.   Gastrointestinal: Negative for nausea and vomiting.  All other systems reviewed and are negative.  Allergies  Amitriptyline; Amphetamines; Atomoxetine; Carisoprodol; Celecoxib; Clonidine derivatives; Codeine; Dextroamphetamine; Diclofenac; Doxepin; Fluoxetine; Guanfacine; Ibuprofen; Imipramine; Lisdexamfetamine; Meloxicam; Meperidine and related; Methadone; Methylphenidate  derivatives; Naltrexone; Selegiline; Tramadol; and Venlafaxine  Home Medications   Prior to Admission medications   Medication Sig Start Date End Date Taking? Authorizing Provider  oxyCODONE (ROXICODONE) 5 MG immediate release tablet Take 1 tablet (5 mg total) by mouth every 4 (four) hours as needed for severe pain. 10/01/15   Lorre NickAnthony Allen, MD   BP 129/84 mmHg  Pulse 90  Temp(Src) 98.1 F (36.7 C) (Oral)  Resp 18  SpO2 100% Physical Exam  Constitutional: She is oriented to person, place, and time. She appears well-developed and well-nourished.  HENT:  Head: Normocephalic and atraumatic.  Eyes: Conjunctivae are normal.  Neck: Normal range of motion. Neck supple.  Cardiovascular: Normal rate and regular rhythm.   Pulmonary/Chest: Effort normal and breath sounds normal.  Musculoskeletal: Normal range of motion.  Neurological: She is alert and oriented to person, place, and time.  Skin: Skin is warm and dry.  Psychiatric: She has a normal mood and affect. Her behavior is normal.    ED Course  Procedures (including critical care time) DIAGNOSTIC STUDIES: Oxygen Saturation is 100% on RA, normal by my interpretation.    COORDINATION OF CARE: 10:24 AM-Discussed treatment plan which includes EKG and chest x-ray with pt at bedside and pt agreed to plan.    Labs Review Labs Reviewed - No data to display  Imaging Review Dg Chest 2 View  12/16/2015  CLINICAL DATA:  Shortness of breath.  Bronchitis. EXAM: CHEST  2 VIEW COMPARISON:  None. FINDINGS: The heart size and mediastinal contours are within normal limits. Both lungs are clear.  The visualized skeletal structures are unremarkable. IMPRESSION: No active cardiopulmonary disease. Electronically Signed   By: Elige Ko   On: 12/16/2015 11:38   I have personally reviewed and evaluated these images and lab results as part of my medical decision-making.   EKG Interpretation None    Normal sinus 79 normal ekg,  Normal qrs. Normal st  normal q  MDM   Final diagnoses:  Shortness of breath   Pt reports the last time she had this she improved with albuterol and prednisone. I will treat again. Pt advised of primary care referral information. An After Visit Summary was printed and given to the patient. Meds ordered this encounter  Medications  . predniSONE (DELTASONE) 10 MG tablet    Sig: 6,5,4,3,2,1 taper    Dispense:  21 tablet    Refill:  0    Order Specific Question:  Supervising Provider    AnswerArby Barrette [[id number]]  . albuterol (PROVENTIL HFA;VENTOLIN HFA) 108 (90 Base) MCG/ACT inhaler    Sig: Inhale 1-2 puffs into the lungs every 6 (six) hours as needed for wheezing or shortness of breath.    Dispense:  1 Inhaler    Refill:  0    Order Specific Question:  Supervising Provider    Answer:  Arby Barrette [[id number]]    Lonia Skinner [place], PA-C 12/16/15 1208  Tilden Fossa, MD 12/17/15 [id number]

## 2016-06-05 ENCOUNTER — Other Ambulatory Visit: Payer: Self-pay | Admitting: Occupational Medicine

## 2016-06-05 ENCOUNTER — Ambulatory Visit: Payer: Self-pay

## 2016-06-05 DIAGNOSIS — Z0000 Encounter for general adult medical examination without abnormal findings: Secondary | ICD-10-CM

## 2016-08-28 NOTE — Progress Notes (Signed)
Pre visit review using our clinic review tool, if applicable. No additional management support is needed unless otherwise documented below in the visit note.  Chief Complaint  Patient presents with  . Establish Care    HPI: Patient  Caitlyn Hall  26 y.o. comes in today for new patient Health Care visit  Mm brenda langston She has not had insurance for about 5 years and comes in because now she does. She has a past history of anxiety depression and bipolar question ADHD was under medication care and counseling sheets she was a young age in [place] area. However she moved to [place] when there was a family issue and her household of father and stepmother. She came [place] there are other relatives in the area and has been at the job she is after the last 6 months. She is happy about this but there is a lot of work and will help her get out of debt. Past history of seeing a "dirty Dr." on a Klonopin 3 times a day and other pills but never IV drug use or other. Always prescription. She got off of these meds and since December has stopped smoking. However because of stress and worry and anxiety off her regular medicine she's began using alcohol one bottle of wine a day. She is having some anxiety and fear being in certain places as afraid is checking addressing affecting her drop performance and doesn't want to lose her job. She is substance free. Asking for help. She doesn't remember which medicines worked the best for her but was aware of some they gave her side effects such as Wellbutrin and prozac?thinks that Celexa may have been helpful.  Adnexal cyst  recurrent would like to go back on you've a ring. Last Pap was about 5 years ago l ct 2017 Hx sob given rx in ed  Health Maintenance  Topic Date Due  . HIV Screening  12/02/2005  . TETANUS/TDAP  12/02/2009  . PAP SMEAR  12/03/2011  . 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 GI /GU: No adominal pain, vomiting, change in bowel habits. No blood in the stool. No significant GU symptoms. SKIN/HEME: ,no acute skin rashes suspicious lesions or bleeding. No lymphadenopathy, nodules, masses.  NEURO/ PSYCH:  No neurologic signs such as weakness numbness. No depression anxiety. IMM/ Allergy: No unusual infections.  Allergy .   REST of 12 system review negative except as per HPI   Past Medical History:  Diagnosis Date  . Alcohol abuse   . Anxiety   . Bipolar disorder (HCC)   . Chicken pox   . Depression   . Ovarian cyst   . Seasonal allergies     Past Surgical History:  Procedure Laterality Date  . TONSILLECTOMY      Family History  Problem Relation Age of Onset  . Alcohol abuse Mother   . Drug abuse Mother   . Alcohol abuse Father   .  Drug abuse Father   . Hyperlipidemia Father   . Hypertension Father   . Depression Father   . Diabetes Father   . Other Sister     trisomy 1018  sib died trisomy 1818 mom may have had MH issues  Father uses prescription drugs  Per patient  Social History   Social History  . Marital status: Single    Spouse name: N/A  . Number of children: 0  . Years of education: N/A   Occupational History  . Production [employer]   Social History Main Topics  . Smoking status: Former Smoker    Types: Cigarettes    Quit date: 07/27/2016  . Smokeless tobacco: Never Used  . Alcohol use Yes     Comment: 1 bottle of wine per day  . Drug use: No  .  Sexual activity: Yes    Partners: Male    Birth control/ protection: Condom   Other Topics Concern  . None   Social History Narrative   5-9 hours of sleep   Lives alone pet dog    Drinks 1 bottle of wine daily   Works [employer] Engineer, agriculturalproduction worker.   Originally from [place] area has grandparents locally.   HS graduate   Parents were divorced when she was young.   History of prescription med dependence. Now off. History of excess alcohol use.   Remote hx of OD on xanax age 26 or 6814    Neg ets FA       EXAM:  BP (!) 150/90 (BP Location: Left Arm, Patient Position: Sitting, Cuff Size: Normal)   Temp 98.2 F (36.8 C) (Oral)   Ht 5' 6.5" (1.689 m)   Wt 167 lb (75.8 kg)   LMP 08/19/2016 (Within Days)   BMI 26.55 kg/m   Body mass index is 26.55 kg/m. Wt Readings from Last 3 Encounters:  09/04/16 167 lb (75.8 kg)  10/01/15 160 lb 4.8 oz (72.7 kg)  04/08/15 170 lb (77.1 kg)    Physical Exam: Vital signs reviewed AOZ:HYQMGEN:This is a well-developed well-nourished alert cooperative    who appearsr stated age in no acute distress.  Mild anxiety nl speech good eye contact  HEENT: normocephalic atraumatic , Eyes: PERRL EOM's full, conjunctiva clear, Nares: paten,t no deformity discharge or tenderness., Ears: no deformity EAC's clear TMs with normal landmarks. Mouth: clear OP, no lesions, edema.  Moist mucous membranes. Dentition in adequate repair. NECK: supple without masses, thyromegaly or bruits. CHEST/PULM:  Clear to auscultation and percussion breath sounds equal no wheeze , rales or rhonchi. No chest wall deformities or tenderness.  . CV: PMI is nondisplaced, S1 S2 no gallops, murmurs, rubs. Peripheral pulses are full without delay. ABDOMEN: Bowel sounds normal nontender  No guard or rebound, no hepato splenomegal no CVA tenderness.  Extremtities:  No clubbing cyanosis or edema, no acute joint swelling or redness no focal atrophy NEURO:  Oriented x3, cranial nerves 3-12 appear to  be intact, no obvious focal weakness,gait within normal limits no abnormal reflexes or asymmetrical SKIN: No acute rashes normal turgor, color, no bruising or petechiae. PSYCH: Oriented, good eye contact, anxiety,seeming nl  cognition and judgment appear normal. LN: no cervical axillary inguinal adenopathy  Lab Results  Component Value Date   WBC 7.4 10/01/2015   HGB 14.4 10/01/2015   HCT 44.6 10/01/2015   PLT 197 10/01/2015   GLUCOSE 118 (H) 10/01/2015   ALT 14 10/01/2015   AST 27 10/01/2015   NA 140 10/01/2015  K 4.7 10/01/2015   CL 103 10/01/2015   CREATININE 0.77 10/01/2015   BUN 8 10/01/2015   CO2 27 10/01/2015    BP Readings from Last 3 Encounters:  09/04/16 (!) 150/90  12/16/15 129/84  10/01/15 118/87   Wt Readings from Last 3 Encounters:  09/04/16 167 lb (75.8 kg)  10/01/15 160 lb 4.8 oz (72.7 kg)  04/08/15 170 lb (77.1 kg)    Lab results reviewed with patient   ASSESSMENT AND PLAN:  Discussed the following assessment and plan:  Anxiety - Plan: Ambulatory referral to Psychiatry  Bipolar affective disorder, remission status unspecified (HCC) - Plan: Ambulatory referral to Psychiatry  Depression, unspecified depression type - Plan: Ambulatory referral to Psychiatry  Encounter for initial prescription of vaginal ring hormonal contraceptive  Cyst of ovary, unspecified laterality  Encounter to establish care with new doctor  Elevated BP without diagnosis of hypertension  Routine screening for STI (sexually transmitted infection) - Plan: Basic metabolic panel, CBC with Differential/Platelet, Hepatic function panel, Lipid panel, TSH, RPR, HIV antibody, Hepatitis C antibody, Hepatitis B surface antigen  Visit for preventive health examination - Plan: Basic metabolic panel, CBC with Differential/Platelet, Hepatic function panel, Lipid panel, TSH, RPR, HIV antibody, Hepatitis C antibody, Hepatitis B surface antigen No records available today the patient gives at  fairly complete history she also has a list of medications based on genetic profiling of medications that would be helpful versus harmful. Can't quite remember which medicines did the past but remembers you that had issues see above. Will try citalopram discussing the risk of causing mania and bipolar but if helped her anxiety depression may be helpful for now We'll refer to psychiatry advised for medication management but would avoid benzos because of her history of dependency. Discussed ways to decrease the amount of alcohol this time and congratulations to stop smoking. Plan laboratory studies and STI screening and follow-up exam with Pap smear in about 3-4 weeks. Information given on behavioral counseling. Follow-up blood pressure at recheck. Okay to start NuvaRing which she's been on in the past warned that it does have a higher estrogen content she has no history of clotting. Patient Care Team: No Pcp Per Patient as PCP - General (General Practice) Patient Instructions  Can begin citalopram 10 mg a day and after week increase to 20 mg a day. If it makes you feel worse let us know. Plan follow-up physical exam Pap smear etc. in 3-4 weeks. Sometimes need a mood stabilitzer also   And  Need specialty prescriber to help with this. Make an appointment to get fasting blood work I'll put the orders in to be done before your next visit so we can review the results. Prescription for NuvaRing stay tobacco free good for you. Consider stop measures in regard to decreasing alcohol intake. [place] [facility], Behavioral Health about getting a Veterinary surgeon. [email or web address] [facility] also  We will do a referral in regard to medication evaluation for anxiety depression: Etc. Try to get healthy  exercise that can  Help decrease anxiety depression.   Glad you are taking positive steps  f or yourself.      Bipolar 1 Disorder Bipolar 1  disorder is a mental health disorder in which a person has episodes of emotional highs (mania), and may also have episodes of emotional lows (depression) in addition to highs. Bipolar 1 disorder is different from other bipolar disorders because it involves extreme manic episodes. These episodes last at least  one week or involve symptoms that are so severe that hospitalization is needed to keep the person safe. What increases the risk? The cause of this condition is not known. However, certain factors make you more likely to have bipolar disorder, such as:  Having a family member with the disorder.  An imbalance of certain chemicals in the brain (neurotransmitters).  Stress, such as illness, financial problems, or a death.  Certain conditions that affect the brain or spinal cord (neurologic conditions).  Brain injury (trauma).  Having another mental health disorder, such as:  Obsessive compulsive disorder.  Schizophrenia. What are the signs or symptoms? Symptoms of mania include:  Very high self-esteem or self-confidence.  Decreased need for sleep.  Unusual talkativeness or feeling a need to keep talking. Speech may be very fast. It may seem like you cannot stop talking.  Racing thoughts or constant talking, with quick shifts between topics that may or may not be related (flight of ideas).  Decreased ability to focus or concentrate.  Increased purposeful activity, such as work, studies, or social activity.  Increased nonproductive activity. This could be pacing, squirming and fidgeting, or finger and toe tapping.  Impulsive behavior and poor judgment. This may result in high-risk activities, such as having unprotected sex or spending a lot of money. Symptoms of depression include:  Feeling sad, hopeless, or helpless.  Frequent or uncontrollable crying.  Lack of feeling or caring about anything.  Sleeping too much.  Moving more slowly than usual.  Not being able to  enjoy things you used to enjoy.  Wanting to be alone all the time.  Feeling guilty or worthless.  Lack of energy or motivation.  Trouble concentrating or remembering.  Trouble making decisions.  Increased appetite.  Thoughts of death, or the desire to harm yourself. Sometimes, you may have a mixed mood. This means having symptoms of depression and mania. Stress can make symptoms worse. How is this diagnosed? To diagnose bipolar disorder, your health care provider may ask about your:  Emotional episodes.  Medical history.  Alcohol and drug use. This includes prescription medicines. Certain medical conditions and substances can cause symptoms that seem like bipolar disorder (secondary bipolar disorder). How is this treated? Bipolar disorder is a long-term (chronic) illness. It is best controlled with ongoing (continuous) treatment rather than treatment only when symptoms occur. Treatment may include:  Medicine. Medicine can be prescribed by a provider who specializes in treating mental disorders (psychiatrist).  Medicines called mood stabilizers are usually prescribed.  If symptoms occur even while taking a mood stabilizer, other medicines may be added.  Psychotherapy. Some forms of talk therapy, such as cognitive-behavioral therapy (CBT), can provide support, education, and guidance.  Coping methods, such as journaling or relaxation exercises. These may include:  Yoga.  Meditation.  Deep breathing.  Lifestyle changes, such as:  Limiting alcohol and drug use.  Exercising regularly.  Getting plenty of sleep.  Making healthy eating choices. A combination of medicine, talk therapy, and coping methods is best. A procedure in which electricity is applied to the brain through the scalp (electroconvulsive therapy) may be used in cases of severe mania when medicine and psychotherapy work too slowly or do not work. Follow these instructions at home: Activity  Return to  your normal activities as told by your health care provider.  Find activities that you enjoy, and make time to do them.  Exercise regularly as told by your health care provider. Lifestyle  Limit alcohol intake to no more  than 1 drink a day for nonpregnant women and 2 drinks a day for men. One drink equals 12 oz of beer, 5 oz of wine, or 1 oz of hard liquor.  Follow a set schedule for eating and sleeping.  Eat a balanced diet that includes fresh fruits and vegetables, whole grains, low-fat dairy, and lean meat.  Get 7-8 hours of sleep each night. General instructions  Take over-the-counter and prescription medicines only as told by your health care provider.  Think about joining a support group. Your health care provider may be able to recommend a support group.  Talk with your family and loved ones about your treatment goals and how they can help.  Keep all follow-up visits as told by your health care provider. This is important. Where to find more information: For more information about bipolar disorder, visit the following websites:  The [employer] on Mental Illness: www.nami.org  [employer] of Mental Health: http://www.maynard.net/ Contact a health care provider if:  Your symptoms get worse.  You have side effects from your medicine, and they get worse.  You have trouble sleeping.  You have trouble doing daily activities.  You feel unsafe in your surroundings.  You are dealing with substance abuse. Get help right away if:  You have new symptoms.  You have thoughts about harming yourself.  You self-harm. This information is not intended to replace advice given to you by your health care provider. Make sure you discuss any questions you have with your health care provider. Document Released: 10/14/2000 Document Revised: 03/03/2016 Document Reviewed: 03/07/2016 Elsevier Interactive Patient Education  2017 [employer].      [place]. Amanie Mcculley M.D.

## 2016-09-04 ENCOUNTER — Encounter: Payer: Self-pay | Admitting: Internal Medicine

## 2016-09-04 ENCOUNTER — Ambulatory Visit (INDEPENDENT_AMBULATORY_CARE_PROVIDER_SITE_OTHER): Payer: Managed Care, Other (non HMO) | Admitting: Internal Medicine

## 2016-09-04 VITALS — BP 150/90 | Temp 98.2°F | Ht 66.5 in | Wt 167.0 lb

## 2016-09-04 DIAGNOSIS — R030 Elevated blood-pressure reading, without diagnosis of hypertension: Secondary | ICD-10-CM

## 2016-09-04 DIAGNOSIS — F319 Bipolar disorder, unspecified: Secondary | ICD-10-CM | POA: Diagnosis not present

## 2016-09-04 DIAGNOSIS — Z0000 Encounter for general adult medical examination without abnormal findings: Secondary | ICD-10-CM

## 2016-09-04 DIAGNOSIS — Z30015 Encounter for initial prescription of vaginal ring hormonal contraceptive: Secondary | ICD-10-CM | POA: Diagnosis not present

## 2016-09-04 DIAGNOSIS — F419 Anxiety disorder, unspecified: Secondary | ICD-10-CM

## 2016-09-04 DIAGNOSIS — F329 Major depressive disorder, single episode, unspecified: Secondary | ICD-10-CM | POA: Diagnosis not present

## 2016-09-04 DIAGNOSIS — Z113 Encounter for screening for infections with a predominantly sexual mode of transmission: Secondary | ICD-10-CM

## 2016-09-04 DIAGNOSIS — N83209 Unspecified ovarian cyst, unspecified side: Secondary | ICD-10-CM

## 2016-09-04 DIAGNOSIS — Z7689 Persons encountering health services in other specified circumstances: Secondary | ICD-10-CM

## 2016-09-04 DIAGNOSIS — F32A Depression, unspecified: Secondary | ICD-10-CM

## 2016-09-04 MED ORDER — CITALOPRAM HYDROBROMIDE 20 MG PO TABS: ORAL_TABLET | ORAL | 3 refills | Status: DC

## 2016-09-04 MED ORDER — ETONOGESTREL-ETHINYL ESTRADIOL 0.12-0.015 MG/24HR VA RING: VAGINAL_RING | VAGINAL | 3 refills | Status: DC

## 2016-09-04 NOTE — Patient Instructions (Addendum)
Can begin citalopram 10 mg a day and after week increase to 20 mg a day. If it makes you feel worse let us know. Plan follow-up physical exam Pap smear etc. in 3-4 weeks. Sometimes need a mood stabilitzer also   And  Need specialty prescriber to help with this. Make an appointment to get fasting blood work I'll put the orders in to be done before your next visit so we can review the results. Prescription for NuvaRing stay tobacco free good for you. Consider stop measures in regard to decreasing alcohol intake. [place] [facility], Behavioral Health about getting a Veterinary surgeon. [email or web address] [facility] also  We will do a referral in regard to medication evaluation for anxiety depression: Etc. Try to get healthy  exercise that can  Help decrease anxiety depression.   Glad you are taking positive steps  f or yourself.      Bipolar 1 Disorder Bipolar 1 disorder is a mental health disorder in which a person has episodes of emotional highs (mania), and may also have episodes of emotional lows (depression) in addition to highs. Bipolar 1 disorder is different from other bipolar disorders because it involves extreme manic episodes. These episodes last at least one week or involve symptoms that are so severe that hospitalization is needed to keep the person safe. What increases the risk? The cause of this condition is not known. However, certain factors make you more likely to have bipolar disorder, such as:  Having a family member with the disorder.  An imbalance of certain chemicals in the brain (neurotransmitters).  Stress, such as illness, financial problems, or a death.  Certain conditions that affect the brain or spinal cord (neurologic conditions).  Brain injury (trauma).  Having another mental health disorder, such as:  Obsessive compulsive disorder.  Schizophrenia. What are the signs or symptoms? Symptoms of mania  include:  Very high self-esteem or self-confidence.  Decreased need for sleep.  Unusual talkativeness or feeling a need to keep talking. Speech may be very fast. It may seem like you cannot stop talking.  Racing thoughts or constant talking, with quick shifts between topics that may or may not be related (flight of ideas).  Decreased ability to focus or concentrate.  Increased purposeful activity, such as work, studies, or social activity.  Increased nonproductive activity. This could be pacing, squirming and fidgeting, or finger and toe tapping.  Impulsive behavior and poor judgment. This may result in high-risk activities, such as having unprotected sex or spending a lot of money. Symptoms of depression include:  Feeling sad, hopeless, or helpless.  Frequent or uncontrollable crying.  Lack of feeling or caring about anything.  Sleeping too much.  Moving more slowly than usual.  Not being able to enjoy things you used to enjoy.  Wanting to be alone all the time.  Feeling guilty or worthless.  Lack of energy or motivation.  Trouble concentrating or remembering.  Trouble making decisions.  Increased appetite.  Thoughts of death, or the desire to harm yourself. Sometimes, you may have a mixed mood. This means having symptoms of depression and mania. Stress can make symptoms worse. How is this diagnosed? To diagnose bipolar disorder, your health care provider may ask about your:  Emotional episodes.  Medical history.  Alcohol and drug use. This includes prescription medicines. Certain medical conditions and substances can cause symptoms that seem like bipolar disorder (secondary bipolar disorder). How is this treated? Bipolar disorder is a long-term (chronic) illness.  It is best controlled with ongoing (continuous) treatment rather than treatment only when symptoms occur. Treatment may include:  Medicine. Medicine can be prescribed by a provider who specializes in  treating mental disorders (psychiatrist).  Medicines called mood stabilizers are usually prescribed.  If symptoms occur even while taking a mood stabilizer, other medicines may be added.  Psychotherapy. Some forms of talk therapy, such as cognitive-behavioral therapy (CBT), can provide support, education, and guidance.  Coping methods, such as journaling or relaxation exercises. These may include:  Yoga.  Meditation.  Deep breathing.  Lifestyle changes, such as:  Limiting alcohol and drug use.  Exercising regularly.  Getting plenty of sleep.  Making healthy eating choices. A combination of medicine, talk therapy, and coping methods is best. A procedure in which electricity is applied to the brain through the scalp (electroconvulsive therapy) may be used in cases of severe mania when medicine and psychotherapy work too slowly or do not work. Follow these instructions at home: Activity  Return to your normal activities as told by your health care provider.  Find activities that you enjoy, and make time to do them.  Exercise regularly as told by your health care provider. Lifestyle  Limit alcohol intake to no more than 1 drink a day for nonpregnant women and 2 drinks a day for men. One drink equals 12 oz of beer, 5 oz of wine, or 1 oz of hard liquor.  Follow a set schedule for eating and sleeping.  Eat a balanced diet that includes fresh fruits and vegetables, whole grains, low-fat dairy, and lean meat.  Get 7-8 hours of sleep each night. General instructions  Take over-the-counter and prescription medicines only as told by your health care provider.  Think about joining a support group. Your health care provider may be able to recommend a support group.  Talk with your family and loved ones about your treatment goals and how they can help.  Keep all follow-up visits as told by your health care provider. This is important. Where to find more information: For more  information about bipolar disorder, visit the following websites:  The [employer] on Mental Illness: www.nami.org  U.S. [employer] of Mental Health: http://www.maynard.net/www.nimh.nih.gov Contact a health care provider if:  Your symptoms get worse.  You have side effects from your medicine, and they get worse.  You have trouble sleeping.  You have trouble doing daily activities.  You feel unsafe in your surroundings.  You are dealing with substance abuse. Get help right away if:  You have new symptoms.  You have thoughts about harming yourself.  You self-harm. This information is not intended to replace advice given to you by your health care provider. Make sure you discuss any questions you have with your health care provider. Document Released: 10/14/2000 Document Revised: 03/03/2016 Document Reviewed: 03/07/2016 Elsevier Interactive Patient Education  2017 [employer]

## 2016-09-24 NOTE — Progress Notes (Signed)
Chief Complaint  Patient presents with  . Annual Exam    HPI: Patient  Caitlyn Hall  26 y.o. comes in today for Preventive Health Care visit  w pap see last initial visit about  deprssion and asnxiety   Began celexa pending further  specialtyu eval  Since that time.  Doing much better  Since last time.  Friends and  family feel that she is acting like her self again and pt denies  Manic sx .  Some dec in libido otherwise ok   work feels better . appt with psych delayed cause of need for 100$  Up front and waiting to get next pay check and plans appt end of march .  No new sx  thyroid disease in family .  Eating better  Less mood eating   Health Maintenance  Topic Date Due  . HIV Screening  12/02/2005  . TETANUS/TDAP  12/02/2009  . PAP SMEAR  12/03/2011  . INFLUENZA VACCINE  Addressed   Health Maintenance Review LIFESTYLE:  Exercise:  Active  Tobacco/ETS:n Alcohol: seelast Sugar beverages: Sleep:better   sorking 60 hours  Drug use: no  Work:long hours some ot    ROS:  GEN/ HEENT: No fever, significant weight changes sweats headaches vision problems hearing changes, CV/ PULM; No chest pain shortness of breath cough, syncope,edema  change in exercise tolerance. GI /GU: No adominal pain, vomiting, change in bowel habits. No blood in the stool. No significant GU symptoms. SKIN/HEME: ,no acute skin rashes suspicious lesions or bleeding. No lymphadenopathy, nodules, masses.  NEURO/ PSYCH:  No neurologic signs such as weakness numbness. No depression anxiety. IMM/ Allergy: No unusual infections.  Allergy .   REST of 12 system review negative except as per HPI   Past Medical History:  Diagnosis Date  . Alcohol abuse   . Anxiety   . Bipolar disorder ([place])   . Chicken pox   . Depression   . Ovarian cyst   . Seasonal allergies     Past Surgical History:  Procedure Laterality Date  . TONSILLECTOMY      Family History  Problem Relation Age of Onset  . Alcohol  abuse Mother   . Drug abuse Mother   . Alcohol abuse Father   . Drug abuse Father   . Hyperlipidemia Father   . Hypertension Father   . Depression Father   . Diabetes Father   . Other Sister     trisomy 26    Social History   Social History  . Marital status: Single    Spouse name: N/A  . Number of children: 0  . Years of education: N/A   Occupational History  . Production [employer]   Social History Main Topics  . Smoking status: Former Smoker    Types: Cigarettes    Quit date: 07/27/2016  . Smokeless tobacco: Never Used  . Alcohol use Yes     Comment: 1 bottle of wine per day  . Drug use: No  . Sexual activity: Yes    Partners: Male    Birth control/ protection: Inserts     Comment: nuva ring    Other Topics Concern  . None   Social History Narrative   5-9 hours of sleep   Lives alone pet dog    Drinks 1 bottle of wine daily   Works [employer] Engineer, maintenance (IT).   Originally from [place] area has grandparents locally.   HS graduate   Parents were divorced  when she was young.   History of prescription med dependence. Now off. History of excess alcohol use.   Remote hx of OD on xanax age 54 or 1    Neg ets FA    Outpatient Medications Prior to Visit  Medication Sig Dispense Refill  . citalopram (CELEXA) 20 MG tablet Take 10 mg per day for 1 week and increase to 20 mg per day 30 tablet 3  . etonogestrel-ethinyl estradiol (NUVARING) 0.12-0.015 MG/24HR vaginal ring Insert vaginally and leave in place for 3 consecutive weeks, then remove for 1 week. 1 each 3   No facility-administered medications prior to visit.      EXAM:  BP 120/80 (BP Location: Left Arm, Patient Position: Sitting, Cuff Size: Normal)   Pulse 75   Temp 98.3 F (36.8 C) (Oral)   Ht 5' 6"  (1.676 m)   Wt 164 lb 8 oz (74.6 kg)   BMI 26.55 kg/m   Body mass index is 26.55 kg/m. Wt Readings from Last 3 Encounters:  09/26/16 164 lb 8 oz (74.6 kg)  09/04/16 167 lb (75.8 kg)    10/01/15 160 lb 4.8 oz (72.7 kg)    Physical Exam: Vital signs reviewed JKD:TOIZ is a well-developed well-nourished alert cooperative    who appearsr stated age in no acute distress.  HEENT: normocephalic atraumatic , Eyes: PERRL EOM's full, conjunctiva clear, Nares: paten,t no deformity discharge or tenderness., Ears: no deformity EAC's clear TMs with normal landmarks. Mouth: clear OP, no lesions, edema.  Moist mucous membranes. Dentition in adequate repair. NECK: supple without masses, thyromegaly or bruits. CHEST/PULM:  Clear to auscultation and percussion breath sounds equal no wheeze , rales or rhonchi. No chest wall deformities or tenderness. Breast: normal by inspection . No dimpling, discharge, masses, tenderness or discharge . CV: PMI is nondisplaced, S1 S2 no gallops, murmurs, rubs. Peripheral pulses are full without delay.No JVD .  ABDOMEN: Bowel sounds normal nontender  No guard or rebound, no hepato splenomegal no CVA tenderness.  No hernia. Extremtities:  No clubbing cyanosis or edema, no acute joint swelling or redness no focal atrophy NEURO:  Oriented x3, cranial nerves 3-12 appear to be intact, no obvious focal weakness,gait within normal limits no abnormal reflexes or asymmetrical SKIN: No acute rashes normal turgor, color, no bruising or petechiae. tatoos  PSYCH: Oriented, good eye contact, no obvious depression anxiety, cognition and judgment appear normal. LN: no cervical axillary inguinal adenopathy Pelvic: NL ext GU, labia clear without lesions or rash . Vagina no lesions .Cervix: clear min flat ectopy  UTERUS: Neg CMT Adnexa:  clear no masses . PAP done screen chlamydia    BP Readings from Last 3 Encounters:  09/26/16 120/80  09/04/16 (!) 150/90  12/16/15 129/84   Wt Readings from Last 3 Encounters:  09/26/16 164 lb 8 oz (74.6 kg)  09/04/16 167 lb (75.8 kg)  10/01/15 160 lb 4.8 oz (72.7 kg)    Lab results reviewed with patient   ASSESSMENT AND  PLAN:  Discussed the following assessment and plan:  Visit for preventive health examination - Plan: Basic metabolic panel, CBC with Differential/Platelet, Hepatic function panel, Lipid panel, TSH, HIV antibody  Encounter for gynecological examination without abnormal finding  Pap smear for cervical cancer screening - Plan: PAP [[place]]  Medication management - continue citalopram benefoir more than risk   see text .  Doing much better no manic signs keep plan for f with behavioral health provider as possible.  bp better .  Fu 2-3 months depending on how  doing  Patient Care Team: No Pcp Per Patient as PCP - General (General Practice) Patient Instructions  Glad you are feeling better  Will le you know when labs and pap results back .  FU depending on when you get in with psych provider .   Or If not ROV  in   2-3 months   Health Maintenance, Female Adopting a healthy lifestyle and getting preventive care can go a long way to promote health and wellness. Talk with your health care provider about what schedule of regular examinations is right for you. This is a good chance for you to check in with your provider about disease prevention and staying healthy. In between checkups, there are plenty of things you can do on your own. Experts have done a lot of research about which lifestyle changes and preventive measures are most likely to keep you healthy. Ask your health care provider for more information. Weight and diet Eat a healthy diet  Be sure to include plenty of vegetables, fruits, low-fat dairy products, and lean protein.  Do not eat a lot of foods high in solid fats, added sugars, or salt.  Get regular exercise. This is one of the most important things you can do for your health.  Most adults should exercise for at least 150 minutes each week. The exercise should increase your heart rate and make you sweat (moderate-intensity exercise).  Most adults should also do  strengthening exercises at least twice a week. This is in addition to the moderate-intensity exercise. Maintain a healthy weight  Body mass index (BMI) is a measurement that can be used to identify possible weight problems. It estimates body fat based on height and weight. Your health care provider can help determine your BMI and help you achieve or maintain a healthy weight.  For females 9 years of age and older:  A BMI below 18.5 is considered underweight.  A BMI of 18.5 to 24.9 is normal.  A BMI of 25 to 29.9 is considered overweight.  A BMI of 30 and above is considered obese. Watch levels of cholesterol and blood lipids  You should start having your blood tested for lipids and cholesterol at 26 years of age, then have this test every 5 years.  You may need to have your cholesterol levels checked more often if:  Your lipid or cholesterol levels are high.  You are older than 26 years of age.  You are at high risk for heart disease. Cancer screening Lung Cancer  Lung cancer screening is recommended for adults 65-81 years old who are at high risk for lung cancer because of a history of smoking.  A yearly low-dose CT scan of the lungs is recommended for people who:  Currently smoke.  Have quit within the past 15 years.  Have at least a 30-pack-year history of smoking. A pack year is smoking an average of one pack of cigarettes a day for 1 year.  Yearly screening should continue until it has been 15 years since you quit.  Yearly screening should stop if you develop a health problem that would prevent you from having lung cancer treatment. Breast Cancer  Practice breast self-awareness. This means understanding how your breasts normally appear and feel.  It also means doing regular breast self-exams. Let your health care provider know about any changes, no matter how small.  If you are in your 20s or 30s, you should have a clinical breast  exam (CBE) by a health care  provider every 1-3 years as part of a regular health exam.  If you are 57 or older, have a CBE every year. Also consider having a breast X-ray (mammogram) every year.  If you have a family history of breast cancer, talk to your health care provider about genetic screening.  If you are at high risk for breast cancer, talk to your health care provider about having an MRI and a mammogram every year.  Breast cancer gene (BRCA) assessment is recommended for women who have family members with BRCA-related cancers. BRCA-related cancers include:  Breast.  Ovarian.  Tubal.  Peritoneal cancers.  Results of the assessment will determine the need for genetic counseling and BRCA1 and BRCA2 testing. Cervical Cancer  Your health care provider may recommend that you be screened regularly for cancer of the pelvic organs (ovaries, uterus, and vagina). This screening involves a pelvic examination, including checking for microscopic changes to the surface of your cervix (Pap test). You may be encouraged to have this screening done every 3 years, beginning at age 82.  For women ages 15-65, health care providers may recommend pelvic exams and Pap testing every 3 years, or they may recommend the Pap and pelvic exam, combined with testing for human papilloma virus (HPV), every 5 years. Some types of HPV increase your risk of cervical cancer. Testing for HPV may also be done on women of any age with unclear Pap test results.  Other health care providers may not recommend any screening for nonpregnant women who are considered low risk for pelvic cancer and who do not have symptoms. Ask your health care provider if a screening pelvic exam is right for you.  If you have had past treatment for cervical cancer or a condition that could lead to cancer, you need Pap tests and screening for cancer for at least 20 years after your treatment. If Pap tests have been discontinued, your risk factors (such as having a new sexual  partner) need to be reassessed to determine if screening should resume. Some women have medical problems that increase the chance of getting cervical cancer. In these cases, your health care provider may recommend more frequent screening and Pap tests. Colorectal Cancer  This type of cancer can be detected and often prevented.  Routine colorectal cancer screening usually begins at 26 years of age and continues through 26 years of age.  Your health care provider may recommend screening at an earlier age if you have risk factors for colon cancer.  Your health care provider may also recommend using home test kits to check for hidden blood in the stool.  A small camera at the end of a tube can be used to examine your colon directly (sigmoidoscopy or colonoscopy). This is done to check for the earliest forms of colorectal cancer.  Routine screening usually begins at age 22.  Direct examination of the colon should be repeated every 5-10 years through 26 years of age. However, you may need to be screened more often if early forms of precancerous polyps or small growths are found. Skin Cancer  Check your skin from head to toe regularly.  Tell your health care provider about any new moles or changes in moles, especially if there is a change in a mole's shape or color.  Also tell your health care provider if you have a mole that is larger than the size of a pencil eraser.  Always use sunscreen. Apply sunscreen liberally and  repeatedly throughout the day.  Protect yourself by wearing long sleeves, pants, a wide-brimmed hat, and sunglasses whenever you are outside. Heart disease, diabetes, and high blood pressure  High blood pressure causes heart disease and increases the risk of stroke. High blood pressure is more likely to develop in:  People who have blood pressure in the high end of the normal range (130-139/85-89 mm Hg).  People who are overweight or obese.  People who are African  American.  If you are 30-53 years of age, have your blood pressure checked every 3-5 years. If you are 34 years of age or older, have your blood pressure checked every year. You should have your blood pressure measured twice-once when you are at a hospital or clinic, and once when you are not at a hospital or clinic. Record the average of the two measurements. To check your blood pressure when you are not at a hospital or clinic, you can use:  An automated blood pressure machine at a pharmacy.  A home blood pressure monitor.  If you are between 71 years and 78 years old, ask your health care provider if you should take aspirin to prevent strokes.  Have regular diabetes screenings. This involves taking a blood sample to check your fasting blood sugar level.  If you are at a normal weight and have a low risk for diabetes, have this test once every three years after 26 years of age.  If you are overweight and have a high risk for diabetes, consider being tested at a younger age or more often. Preventing infection Hepatitis B  If you have a higher risk for hepatitis B, you should be screened for this virus. You are considered at high risk for hepatitis B if:  You were born in a country where hepatitis B is common. Ask your health care provider which countries are considered high risk.  Your parents were born in a high-risk country, and you have not been immunized against hepatitis B (hepatitis B vaccine).  You have HIV or AIDS.  You use needles to inject street drugs.  You live with someone who has hepatitis B.  You have had sex with someone who has hepatitis B.  You get hemodialysis treatment.  You take certain medicines for conditions, including cancer, organ transplantation, and autoimmune conditions. Hepatitis C  Blood testing is recommended for:  Everyone born from [birth year] through 1965.  Anyone with known risk factors for hepatitis C. Sexually transmitted infections  (STIs)  You should be screened for sexually transmitted infections (STIs) including gonorrhea and chlamydia if:  You are sexually active and are younger than 26 years of age.  You are older than 26 years of age and your health care provider tells you that you are at risk for this type of infection.  Your sexual activity has changed since you were last screened and you are at an increased risk for chlamydia or gonorrhea. Ask your health care provider if you are at risk.  If you do not have HIV, but are at risk, it may be recommended that you take a prescription medicine daily to prevent HIV infection. This is called pre-exposure prophylaxis (PrEP). You are considered at risk if:  You are sexually active and do not regularly use condoms or know the HIV status of your partner(s).  You take drugs by injection.  You are sexually active with a partner who has HIV. Talk with your health care provider about whether you are at high risk  of being infected with HIV. If you choose to begin PrEP, you should first be tested for HIV. You should then be tested every 3 months for as long as you are taking PrEP. Pregnancy  If you are premenopausal and you may become pregnant, ask your health care provider about preconception counseling.  If you may become pregnant, take 400 to 800 micrograms (mcg) of folic acid every day.  If you want to prevent pregnancy, talk to your health care provider about birth control (contraception). Osteoporosis and menopause  Osteoporosis is a disease in which the bones lose minerals and strength with aging. This can result in serious bone fractures. Your risk for osteoporosis can be identified using a bone density scan.  If you are 9 years of age or older, or if you are at risk for osteoporosis and fractures, ask your health care provider if you should be screened.  Ask your health care provider whether you should take a calcium or vitamin D supplement to lower your risk  for osteoporosis.  Menopause may have certain physical symptoms and risks.  Hormone replacement therapy may reduce some of these symptoms and risks. Talk to your health care provider about whether hormone replacement therapy is right for you. Follow these instructions at home:  Schedule regular health, dental, and eye exams.  Stay current with your immunizations.  Do not use any tobacco products including cigarettes, chewing tobacco, or electronic cigarettes.  If you are pregnant, do not drink alcohol.  If you are breastfeeding, limit how much and how often you drink alcohol.  Limit alcohol intake to no more than 1 drink per day for nonpregnant women. One drink equals 12 ounces of beer, 5 ounces of wine, or 1 ounces of hard liquor.  Do not use street drugs.  Do not share needles.  Ask your health care provider for help if you need support or information about quitting drugs.  Tell your health care provider if you often feel depressed.  Tell your health care provider if you have ever been abused or do not feel safe at home. This information is not intended to replace advice given to you by your health care provider. Make sure you discuss any questions you have with your health care provider. Document Released: 01/21/2011 Document Revised: 12/14/2015 Document Reviewed: 04/11/2015 Elsevier Interactive Patient Education  2017 [place] K. Panosh M.D.

## 2016-09-26 ENCOUNTER — Ambulatory Visit (INDEPENDENT_AMBULATORY_CARE_PROVIDER_SITE_OTHER): Payer: Managed Care, Other (non HMO) | Admitting: Internal Medicine

## 2016-09-26 ENCOUNTER — Encounter: Payer: Self-pay | Admitting: Internal Medicine

## 2016-09-26 VITALS — BP 120/80 | HR 75 | Temp 98.3°F | Ht 66.0 in | Wt 164.5 lb

## 2016-09-26 DIAGNOSIS — Z79899 Other long term (current) drug therapy: Secondary | ICD-10-CM | POA: Diagnosis not present

## 2016-09-26 DIAGNOSIS — Z01419 Encounter for gynecological examination (general) (routine) without abnormal findings: Secondary | ICD-10-CM | POA: Diagnosis not present

## 2016-09-26 DIAGNOSIS — Z124 Encounter for screening for malignant neoplasm of cervix: Secondary | ICD-10-CM | POA: Diagnosis not present

## 2016-09-26 DIAGNOSIS — Z0000 Encounter for general adult medical examination without abnormal findings: Secondary | ICD-10-CM

## 2016-09-26 LAB — CBC WITH DIFFERENTIAL/PLATELET
BASOS ABS: 0.1 10*3/uL (ref 0.0–0.1)
Basophils Relative: 1.2 % (ref 0.0–3.0)
EOS ABS: 0.1 10*3/uL (ref 0.0–0.7)
Eosinophils Relative: 2.1 % (ref 0.0–5.0)
HEMATOCRIT: 40 % (ref 36.0–46.0)
Hemoglobin: 13.4 g/dL (ref 12.0–15.0)
Lymphocytes Relative: 31.1 % (ref 12.0–46.0)
Lymphs Abs: 1.4 10*3/uL (ref 0.7–4.0)
MCHC: 33.6 g/dL (ref 30.0–36.0)
MCV: 94.5 fl (ref 78.0–100.0)
Monocytes Absolute: 0.4 10*3/uL (ref 0.1–1.0)
Monocytes Relative: 7.7 % (ref 3.0–12.0)
NEUTROS ABS: 2.7 10*3/uL (ref 1.4–7.7)
Neutrophils Relative %: 57.9 % (ref 43.0–77.0)
PLATELETS: 217 10*3/uL (ref 150.0–400.0)
RBC: 4.23 Mil/uL (ref 3.87–5.11)
RDW: 13.1 % (ref 11.5–15.5)
WBC: 4.7 10*3/uL (ref 4.0–10.5)

## 2016-09-26 LAB — LIPID PANEL
CHOL/HDL RATIO: 2
Cholesterol: 162 mg/dL (ref 0–200)
HDL: 80.5 mg/dL (ref >39.00)
LDL Cholesterol: 70 mg/dL (ref 0–99)
NONHDL: 81.63
Triglycerides: 59 mg/dL (ref 0.0–149.0)
VLDL: 11.8 mg/dL (ref 0.0–40.0)

## 2016-09-26 LAB — BASIC METABOLIC PANEL
BUN: 13 mg/dL (ref 6–23)
CALCIUM: 9.5 mg/dL (ref 8.4–10.5)
CO2: 29 meq/L (ref 19–32)
CREATININE: 0.78 mg/dL (ref 0.40–1.20)
Chloride: 106 mEq/L (ref 96–112)
GFR: 95.02 mL/min (ref >60.00)
Glucose, Bld: 89 mg/dL (ref 70–99)
Potassium: 4.3 mEq/L (ref 3.5–5.1)
SODIUM: 140 meq/L (ref 135–145)

## 2016-09-26 LAB — TSH: TSH: 1.94 u[IU]/mL (ref 0.35–4.50)

## 2016-09-26 LAB — HEPATIC FUNCTION PANEL
ALK PHOS: 46 U/L (ref 39–117)
ALT: 8 U/L (ref 0–35)
AST: 17 U/L (ref 0–37)
Albumin: 4.1 g/dL (ref 3.5–5.2)
BILIRUBIN DIRECT: 0.1 mg/dL (ref 0.0–0.3)
TOTAL PROTEIN: 6.5 g/dL (ref 6.0–8.3)
Total Bilirubin: 0.5 mg/dL (ref 0.2–1.2)

## 2016-09-26 NOTE — Patient Instructions (Signed)
Glad you are feeling better  Will le you know when labs and pap results back .  FU depending on when you get in with psych provider .   Or If not ROV  in   2-3 months   Health Maintenance, Female Adopting a healthy lifestyle and getting preventive care can go a long way to promote health and wellness. Talk with your health care provider about what schedule of regular examinations is right for you. This is a good chance for you to check in with your provider about disease prevention and staying healthy. In between checkups, there are plenty of things you can do on your own. Experts have done a lot of research about which lifestyle changes and preventive measures are most likely to keep you healthy. Ask your health care provider for more information. Weight and diet Eat a healthy diet  Be sure to include plenty of vegetables, fruits, low-fat dairy products, and lean protein.  Do not eat a lot of foods high in solid fats, added sugars, or salt.  Get regular exercise. This is one of the most important things you can do for your health.  Most adults should exercise for at least 150 minutes each week. The exercise should increase your heart rate and make you sweat (moderate-intensity exercise).  Most adults should also do strengthening exercises at least twice a week. This is in addition to the moderate-intensity exercise. Maintain a healthy weight  Body mass index (BMI) is a measurement that can be used to identify possible weight problems. It estimates body fat based on height and weight. Your health care provider can help determine your BMI and help you achieve or maintain a healthy weight.  For females 81 years of age and older:  A BMI below 18.5 is considered underweight.  A BMI of 18.5 to 24.9 is normal.  A BMI of 25 to 29.9 is considered overweight.  A BMI of 30 and above is considered obese. Watch levels of cholesterol and blood lipids  You should start having your blood tested  for lipids and cholesterol at 26 years of age, then have this test every 5 years.  You may need to have your cholesterol levels checked more often if:  Your lipid or cholesterol levels are high.  You are older than 26 years of age.  You are at high risk for heart disease. Cancer screening Lung Cancer  Lung cancer screening is recommended for adults 36-64 years old who are at high risk for lung cancer because of a history of smoking.  A yearly low-dose CT scan of the lungs is recommended for people who:  Currently smoke.  Have quit within the past 15 years.  Have at least a 30-pack-year history of smoking. A pack year is smoking an average of one pack of cigarettes a day for 1 year.  Yearly screening should continue until it has been 15 years since you quit.  Yearly screening should stop if you develop a health problem that would prevent you from having lung cancer treatment. Breast Cancer  Practice breast self-awareness. This means understanding how your breasts normally appear and feel.  It also means doing regular breast self-exams. Let your health care provider know about any changes, no matter how small.  If you are in your 20s or 30s, you should have a clinical breast exam (CBE) by a health care provider every 1-3 years as part of a regular health exam.  If you are 40 or older, have  a CBE every year. Also consider having a breast X-ray (mammogram) every year.  If you have a family history of breast cancer, talk to your health care provider about genetic screening.  If you are at high risk for breast cancer, talk to your health care provider about having an MRI and a mammogram every year.  Breast cancer gene (BRCA) assessment is recommended for women who have family members with BRCA-related cancers. BRCA-related cancers include:  Breast.  Ovarian.  Tubal.  Peritoneal cancers.  Results of the assessment will determine the need for genetic counseling and BRCA1 and  BRCA2 testing. Cervical Cancer  Your health care provider may recommend that you be screened regularly for cancer of the pelvic organs (ovaries, uterus, and vagina). This screening involves a pelvic examination, including checking for microscopic changes to the surface of your cervix (Pap test). You may be encouraged to have this screening done every 3 years, beginning at age 48.  For women ages 34-65, health care providers may recommend pelvic exams and Pap testing every 3 years, or they may recommend the Pap and pelvic exam, combined with testing for human papilloma virus (HPV), every 5 years. Some types of HPV increase your risk of cervical cancer. Testing for HPV may also be done on women of any age with unclear Pap test results.  Other health care providers may not recommend any screening for nonpregnant women who are considered low risk for pelvic cancer and who do not have symptoms. Ask your health care provider if a screening pelvic exam is right for you.  If you have had past treatment for cervical cancer or a condition that could lead to cancer, you need Pap tests and screening for cancer for at least 20 years after your treatment. If Pap tests have been discontinued, your risk factors (such as having a new sexual partner) need to be reassessed to determine if screening should resume. Some women have medical problems that increase the chance of getting cervical cancer. In these cases, your health care provider may recommend more frequent screening and Pap tests. Colorectal Cancer  This type of cancer can be detected and often prevented.  Routine colorectal cancer screening usually begins at 26 years of age and continues through 26 years of age.  Your health care provider may recommend screening at an earlier age if you have risk factors for colon cancer.  Your health care provider may also recommend using home test kits to check for hidden blood in the stool.  A small camera at the end  of a tube can be used to examine your colon directly (sigmoidoscopy or colonoscopy). This is done to check for the earliest forms of colorectal cancer.  Routine screening usually begins at age 31.  Direct examination of the colon should be repeated every 5-10 years through 26 years of age. However, you may need to be screened more often if early forms of precancerous polyps or small growths are found. Skin Cancer  Check your skin from head to toe regularly.  Tell your health care provider about any new moles or changes in moles, especially if there is a change in a mole's shape or color.  Also tell your health care provider if you have a mole that is larger than the size of a pencil eraser.  Always use sunscreen. Apply sunscreen liberally and repeatedly throughout the day.  Protect yourself by wearing long sleeves, pants, a wide-brimmed hat, and sunglasses whenever you are outside. Heart disease, diabetes, and  high blood pressure  High blood pressure causes heart disease and increases the risk of stroke. High blood pressure is more likely to develop in:  People who have blood pressure in the high end of the normal range (130-139/85-89 mm Hg).  People who are overweight or obese.  People who are African American.  If you are 63-56 years of age, have your blood pressure checked every 3-5 years. If you are 92 years of age or older, have your blood pressure checked every year. You should have your blood pressure measured twice-once when you are at a hospital or clinic, and once when you are not at a hospital or clinic. Record the average of the two measurements. To check your blood pressure when you are not at a hospital or clinic, you can use:  An automated blood pressure machine at a pharmacy.  A home blood pressure monitor.  If you are between 68 years and 25 years old, ask your health care provider if you should take aspirin to prevent strokes.  Have regular diabetes screenings.  This involves taking a blood sample to check your fasting blood sugar level.  If you are at a normal weight and have a low risk for diabetes, have this test once every three years after 26 years of age.  If you are overweight and have a high risk for diabetes, consider being tested at a younger age or more often. Preventing infection Hepatitis B  If you have a higher risk for hepatitis B, you should be screened for this virus. You are considered at high risk for hepatitis B if:  You were born in a country where hepatitis B is common. Ask your health care provider which countries are considered high risk.  Your parents were born in a high-risk country, and you have not been immunized against hepatitis B (hepatitis B vaccine).  You have HIV or AIDS.  You use needles to inject street drugs.  You live with someone who has hepatitis B.  You have had sex with someone who has hepatitis B.  You get hemodialysis treatment.  You take certain medicines for conditions, including cancer, organ transplantation, and autoimmune conditions. Hepatitis C  Blood testing is recommended for:  Everyone born from [birth year] through 1965.  Anyone with known risk factors for hepatitis C. Sexually transmitted infections (STIs)  You should be screened for sexually transmitted infections (STIs) including gonorrhea and chlamydia if:  You are sexually active and are younger than 26 years of age.  You are older than 26 years of age and your health care provider tells you that you are at risk for this type of infection.  Your sexual activity has changed since you were last screened and you are at an increased risk for chlamydia or gonorrhea. Ask your health care provider if you are at risk.  If you do not have HIV, but are at risk, it may be recommended that you take a prescription medicine daily to prevent HIV infection. This is called pre-exposure prophylaxis (PrEP). You are considered at risk if:  You are  sexually active and do not regularly use condoms or know the HIV status of your partner(s).  You take drugs by injection.  You are sexually active with a partner who has HIV. Talk with your health care provider about whether you are at high risk of being infected with HIV. If you choose to begin PrEP, you should first be tested for HIV. You should then be tested every 3  months for as long as you are taking PrEP. Pregnancy  If you are premenopausal and you may become pregnant, ask your health care provider about preconception counseling.  If you may become pregnant, take 400 to 800 micrograms (mcg) of folic acid every day.  If you want to prevent pregnancy, talk to your health care provider about birth control (contraception). Osteoporosis and menopause  Osteoporosis is a disease in which the bones lose minerals and strength with aging. This can result in serious bone fractures. Your risk for osteoporosis can be identified using a bone density scan.  If you are 110 years of age or older, or if you are at risk for osteoporosis and fractures, ask your health care provider if you should be screened.  Ask your health care provider whether you should take a calcium or vitamin D supplement to lower your risk for osteoporosis.  Menopause may have certain physical symptoms and risks.  Hormone replacement therapy may reduce some of these symptoms and risks. Talk to your health care provider about whether hormone replacement therapy is right for you. Follow these instructions at home:  Schedule regular health, dental, and eye exams.  Stay current with your immunizations.  Do not use any tobacco products including cigarettes, chewing tobacco, or electronic cigarettes.  If you are pregnant, do not drink alcohol.  If you are breastfeeding, limit how much and how often you drink alcohol.  Limit alcohol intake to no more than 1 drink per day for nonpregnant women. One drink equals 12 ounces of  beer, 5 ounces of wine, or 1 ounces of hard liquor.  Do not use street drugs.  Do not share needles.  Ask your health care provider for help if you need support or information about quitting drugs.  Tell your health care provider if you often feel depressed.  Tell your health care provider if you have ever been abused or do not feel safe at home. This information is not intended to replace advice given to you by your health care provider. Make sure you discuss any questions you have with your health care provider. Document Released: 01/21/2011 Document Revised: 12/14/2015 Document Reviewed: 04/11/2015 Elsevier Interactive Patient Education  2017 [employer].

## 2016-09-27 LAB — HIV ANTIBODY (ROUTINE TESTING W REFLEX): HIV 1&2 Ab, 4th Generation: NONREACTIVE

## 2016-09-28 LAB — CYTOLOGY - PAP

## 2016-11-13 ENCOUNTER — Other Ambulatory Visit: Payer: Self-pay | Admitting: Emergency Medicine

## 2016-11-13 MED ORDER — ETONOGESTREL-ETHINYL ESTRADIOL 0.12-0.015 MG/24HR VA RING: VAGINAL_RING | VAGINAL | 3 refills | Status: DC

## 2016-11-20 ENCOUNTER — Telehealth: Payer: Self-pay | Admitting: Family Medicine

## 2016-11-20 ENCOUNTER — Other Ambulatory Visit: Payer: Self-pay | Admitting: Family Medicine

## 2016-11-20 MED ORDER — CITALOPRAM HYDROBROMIDE 20 MG PO TABS: ORAL_TABLET | ORAL | 0 refills | Status: AC

## 2016-11-20 NOTE — Telephone Encounter (Signed)
Pt voice mail has not be set up yet unable to leave message

## 2016-11-20 NOTE — Telephone Encounter (Signed)
Pt due for medication follow up 12/2016.  Please help the pt to get on the schedule with Dr. Demetrius Charity.  Thanks!!

## 2016-11-20 NOTE — Telephone Encounter (Signed)
Received a fax from the pharmacy  Pt requesting 90 day supply.  Filled for 4 months (30 day supply) on 09/04/16.  Reviewed chart.  Pt due for follow up 12/2016.  Message sent to scheduling.  #[phone] sent to the pharmacy.  0 refills.

## 2016-11-27 NOTE — Telephone Encounter (Signed)
vm still not set up

## 2016-11-29 ENCOUNTER — Other Ambulatory Visit: Payer: Self-pay | Admitting: Emergency Medicine

## 2016-11-29 MED ORDER — ETONOGESTREL-ETHINYL ESTRADIOL 0.12-0.015 MG/24HR VA RING: VAGINAL_RING | VAGINAL | 3 refills | Status: DC

## 2016-12-05 NOTE — Telephone Encounter (Signed)
vm still not set up °

## 2017-02-18 ENCOUNTER — Other Ambulatory Visit: Payer: Self-pay | Admitting: Internal Medicine

## 2017-05-23 ENCOUNTER — Other Ambulatory Visit: Payer: Self-pay | Admitting: Internal Medicine

## 2018-02-19 IMAGING — DX DG CHEST 2V
2 series · 2 of 2 positions shown · non-contrast
Comparison: None.

CLINICAL DATA: Shortness of breath.  Bronchitis.

EXAM:
CHEST  2 VIEW

[w chest pa]
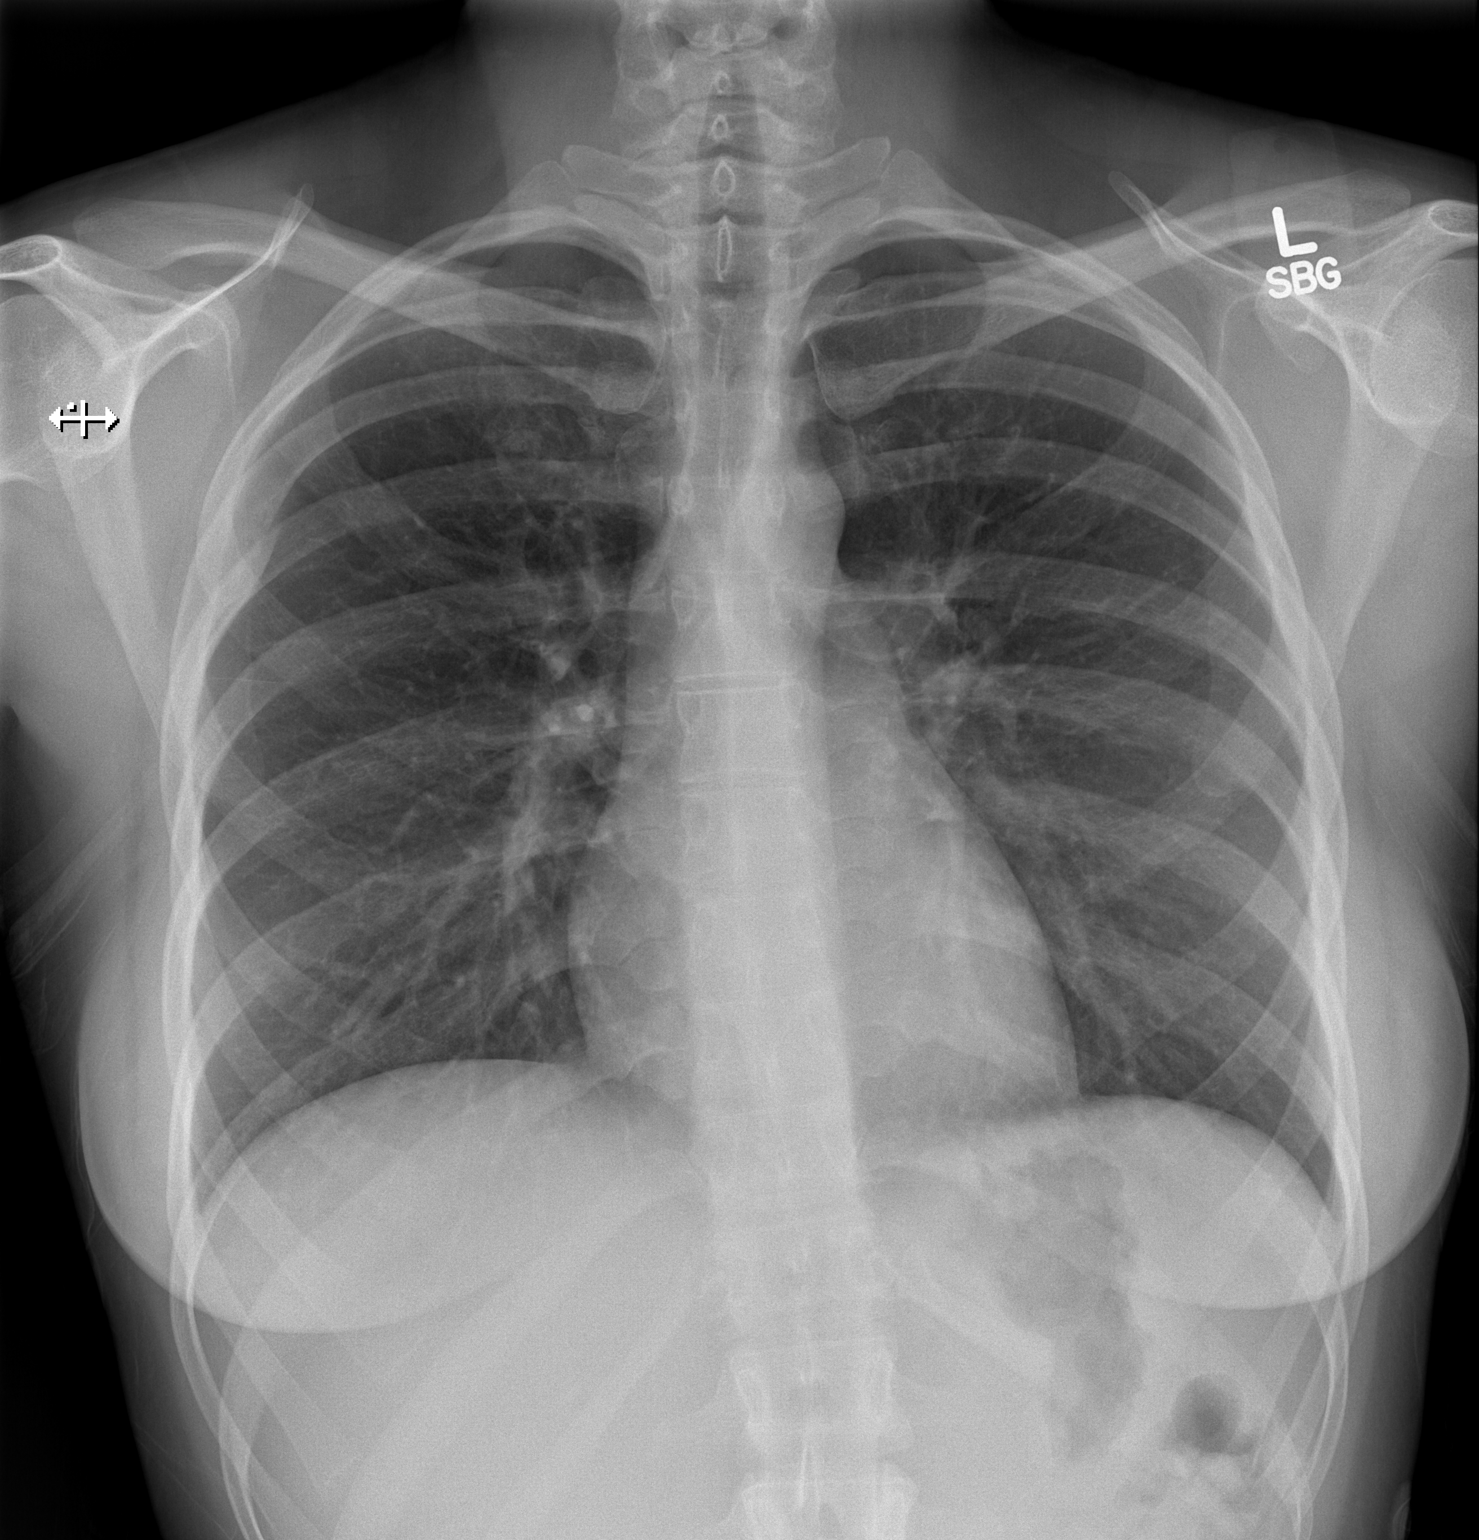

[w chest lat]
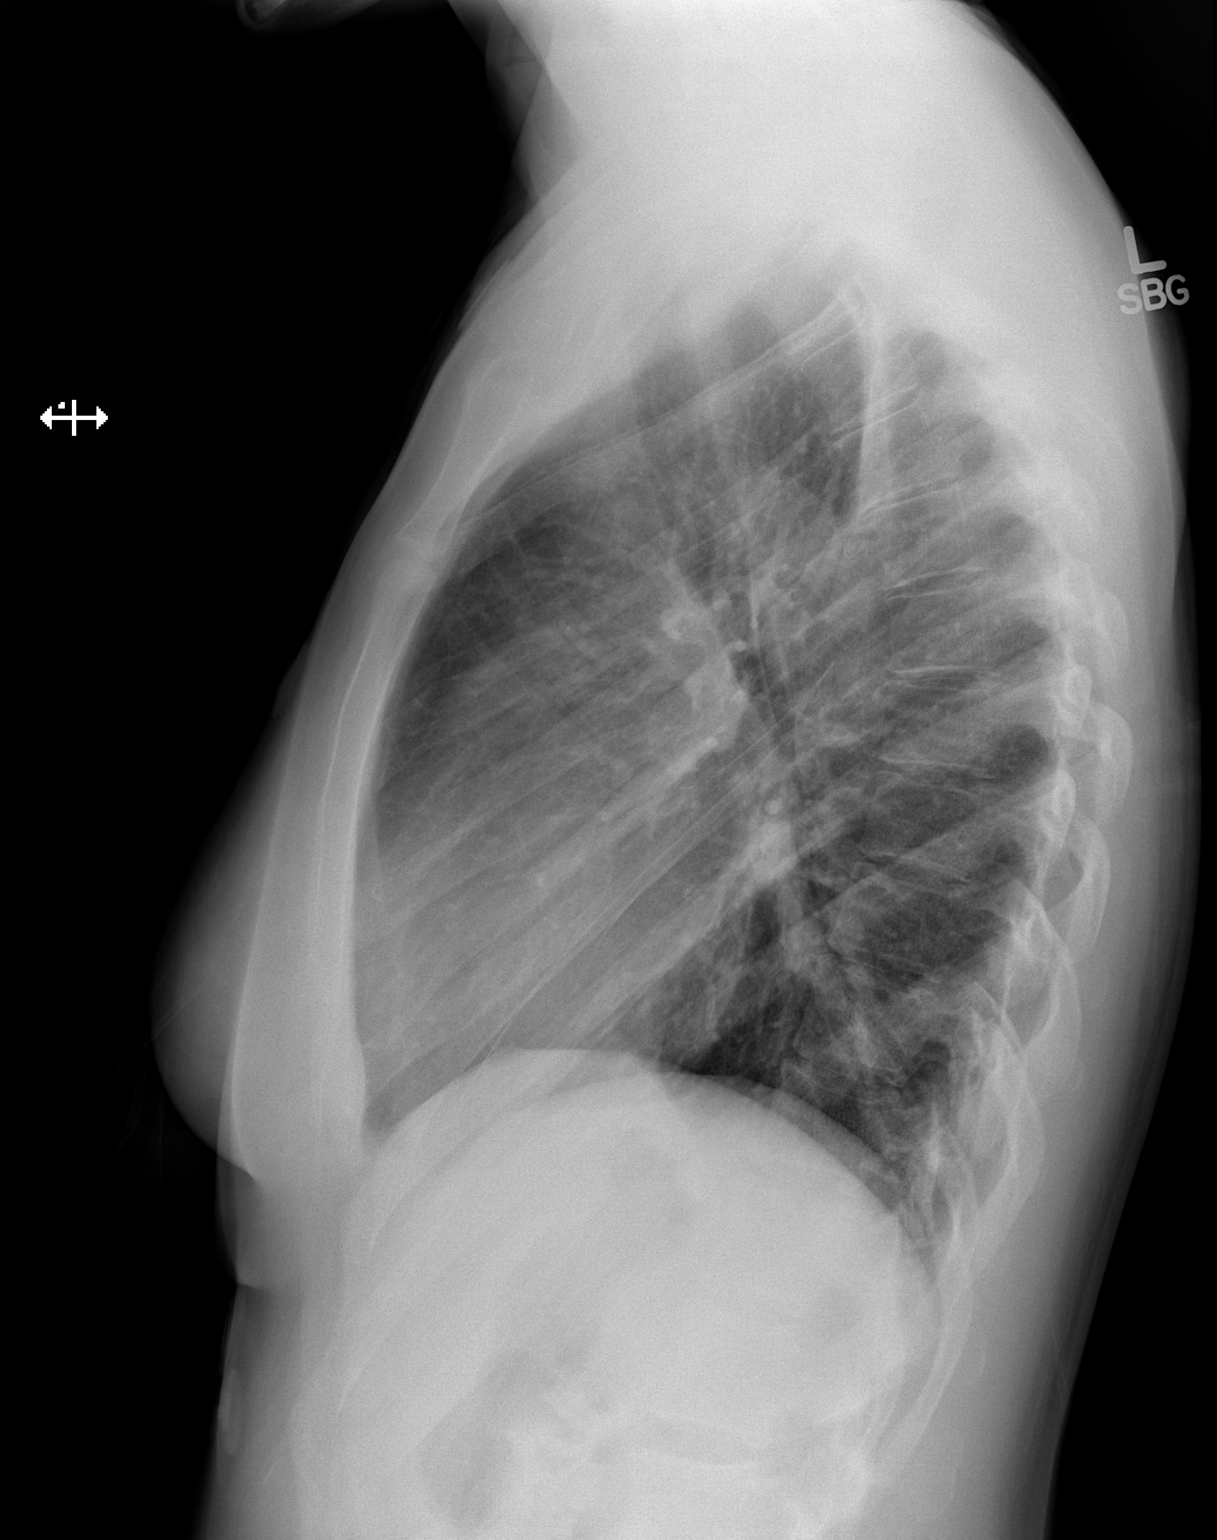

[2 of 2 positions shown; findings below may reference images not displayed]

FINDINGS: The heart size and mediastinal contours are within normal limits.
Both lungs are clear. The visualized skeletal structures are
unremarkable.
IMPRESSION: No active cardiopulmonary disease.
